# Patient Record
Sex: Male | Born: 1935 | Race: White | Hispanic: No | Marital: Married | Smoking: Current every day smoker
Health system: Southern US, Community
[De-identification: ages and names within clinical notes are randomized; demographics above are authoritative.]

## PROBLEM LIST (undated history)

## (undated) DIAGNOSIS — I10 Essential (primary) hypertension: Secondary | ICD-10-CM

## (undated) DIAGNOSIS — G2581 Restless legs syndrome: Secondary | ICD-10-CM

## (undated) DIAGNOSIS — J45909 Unspecified asthma, uncomplicated: Secondary | ICD-10-CM

## (undated) DIAGNOSIS — J449 Chronic obstructive pulmonary disease, unspecified: Secondary | ICD-10-CM

## (undated) HISTORY — PX: KNEE ARTHROPLASTY: SHX992

## (undated) HISTORY — PX: ROTATOR CUFF REPAIR: SHX139

## (undated) HISTORY — PX: BACK SURGERY: SHX140

## (undated) HISTORY — PX: HERNIA REPAIR: SHX51

---

## 2013-01-08 ENCOUNTER — Emergency Department (HOSPITAL_BASED_OUTPATIENT_CLINIC_OR_DEPARTMENT_OTHER): Payer: Medicare Other

## 2013-01-08 ENCOUNTER — Inpatient Hospital Stay (HOSPITAL_BASED_OUTPATIENT_CLINIC_OR_DEPARTMENT_OTHER)
Admission: EM | Admit: 2013-01-08 | Discharge: 2013-01-11 | DRG: 190 | Disposition: A | Discharge status: Home Health | Payer: Medicare Other | Attending: Internal Medicine | Admitting: Internal Medicine

## 2013-01-08 ENCOUNTER — Encounter (HOSPITAL_BASED_OUTPATIENT_CLINIC_OR_DEPARTMENT_OTHER): Payer: Self-pay | Admitting: Emergency Medicine

## 2013-01-08 DIAGNOSIS — I10 Essential (primary) hypertension: Secondary | ICD-10-CM | POA: Diagnosis present

## 2013-01-08 DIAGNOSIS — F17200 Nicotine dependence, unspecified, uncomplicated: Secondary | ICD-10-CM | POA: Diagnosis present

## 2013-01-08 DIAGNOSIS — J189 Pneumonia, unspecified organism: Secondary | ICD-10-CM | POA: Diagnosis present

## 2013-01-08 DIAGNOSIS — Z79899 Other long term (current) drug therapy: Secondary | ICD-10-CM

## 2013-01-08 DIAGNOSIS — J441 Chronic obstructive pulmonary disease with (acute) exacerbation: Secondary | ICD-10-CM

## 2013-01-08 DIAGNOSIS — J9690 Respiratory failure, unspecified, unspecified whether with hypoxia or hypercapnia: Secondary | ICD-10-CM

## 2013-01-08 HISTORY — DX: Essential (primary) hypertension: I10

## 2013-01-08 HISTORY — DX: Chronic obstructive pulmonary disease, unspecified: J44.9

## 2013-01-08 LAB — CBC WITH DIFFERENTIAL/PLATELET
Lymphocytes Relative: 8 % — ABNORMAL LOW (ref 12–46)
Lymphs Abs: 1 10*3/uL (ref 0.7–4.0)
MCHC: 33.6 g/dL (ref 30.0–36.0)
Neutro Abs: 10.9 10*3/uL — ABNORMAL HIGH (ref 1.7–7.7)
Neutrophils Relative %: 88 % — ABNORMAL HIGH (ref 43–77)
Platelets: 191 10*3/uL (ref 150–400)
RBC: 4.53 MIL/uL (ref 4.22–5.81)
RDW: 13.4 % (ref 11.5–15.5)
WBC: 12.4 10*3/uL — ABNORMAL HIGH (ref 4.0–10.5)

## 2013-01-08 LAB — PRO B NATRIURETIC PEPTIDE: Pro B Natriuretic peptide (BNP): 139.4 pg/mL (ref 0–450)

## 2013-01-08 LAB — MRSA PCR SCREENING: MRSA by PCR: NEGATIVE

## 2013-01-08 LAB — BASIC METABOLIC PANEL
CO2: 26 mEq/L (ref 19–32)
Glucose, Bld: 103 mg/dL — ABNORMAL HIGH (ref 70–99)
Potassium: 4.6 mEq/L (ref 3.7–5.3)
Sodium: 140 mEq/L (ref 137–147)

## 2013-01-08 LAB — TROPONIN I: Troponin I: <0.3 ng/mL (ref <0.30)

## 2013-01-08 MED ORDER — ALBUTEROL SULFATE (2.5 MG/3ML) 0.083% IN NEBU
5.0000 mg | INHALATION_SOLUTION | RESPIRATORY_TRACT | Status: AC
  Administered 2013-01-08 (×4): 5 mg via RESPIRATORY_TRACT
  Filled 2013-01-08: qty 6

## 2013-01-08 MED ORDER — DEXTROSE 5 % IV SOLN
1.0000 g | INTRAVENOUS | Status: DC
  Administered 2013-01-08: 1 g via INTRAVENOUS
  Filled 2013-01-08 (×2): qty 10

## 2013-01-08 MED ORDER — SODIUM CHLORIDE 0.9 % IV SOLN
INTRAVENOUS | Status: DC
  Administered 2013-01-08: 22:00:00 via INTRAVENOUS

## 2013-01-08 MED ORDER — ALBUTEROL (5 MG/ML) CONTINUOUS INHALATION SOLN
INHALATION_SOLUTION | RESPIRATORY_TRACT | Status: AC
  Administered 2013-01-08: 15 mg/h via RESPIRATORY_TRACT
  Filled 2013-01-08: qty 20

## 2013-01-08 MED ORDER — METHYLPREDNISOLONE SODIUM SUCC 125 MG IJ SOLR
60.0000 mg | Freq: Four times a day (QID) | INTRAMUSCULAR | Status: DC
  Administered 2013-01-08 – 2013-01-09 (×3): 60 mg via INTRAVENOUS
  Filled 2013-01-08 (×6): qty 0.96

## 2013-01-08 MED ORDER — MOMETASONE FURO-FORMOTEROL FUM 100-5 MCG/ACT IN AERO
2.0000 | INHALATION_SPRAY | Freq: Two times a day (BID) | RESPIRATORY_TRACT | Status: DC
  Administered 2013-01-09: 2 via RESPIRATORY_TRACT
  Filled 2013-01-08: qty 8.8

## 2013-01-08 MED ORDER — ROPINIROLE HCL 1 MG PO TABS
1.0000 mg | ORAL_TABLET | Freq: Two times a day (BID) | ORAL | Status: DC
  Administered 2013-01-08 – 2013-01-11 (×6): 1 mg via ORAL
  Filled 2013-01-08 (×7): qty 1

## 2013-01-08 MED ORDER — ALBUTEROL SULFATE HFA 108 (90 BASE) MCG/ACT IN AERS: 1.0000 | INHALATION_SPRAY | Freq: Four times a day (QID) | RESPIRATORY_TRACT | Status: DC | PRN

## 2013-01-08 MED ORDER — IPRATROPIUM-ALBUTEROL 0.5-2.5 (3) MG/3ML IN SOLN
3.0000 mL | RESPIRATORY_TRACT | Status: DC
  Administered 2013-01-08 – 2013-01-09 (×4): 3 mL via RESPIRATORY_TRACT
  Filled 2013-01-08 (×4): qty 3

## 2013-01-08 MED ORDER — ALBUTEROL (5 MG/ML) CONTINUOUS INHALATION SOLN
15.0000 mg/h | INHALATION_SOLUTION | RESPIRATORY_TRACT | Status: AC
  Administered 2013-01-08: 15 mg/h via RESPIRATORY_TRACT

## 2013-01-08 MED ORDER — LEVOFLOXACIN IN D5W 750 MG/150ML IV SOLN
750.0000 mg | Freq: Once | INTRAVENOUS | Status: AC
  Administered 2013-01-08: 750 mg via INTRAVENOUS
  Filled 2013-01-08: qty 150

## 2013-01-08 MED ORDER — ALBUTEROL SULFATE (2.5 MG/3ML) 0.083% IN NEBU
2.5000 mg | INHALATION_SOLUTION | RESPIRATORY_TRACT | Status: DC | PRN
  Administered 2013-01-10: 23:00:00 2.5 mg via RESPIRATORY_TRACT
  Filled 2013-01-08: qty 3

## 2013-01-08 MED ORDER — METHYLPREDNISOLONE SODIUM SUCC 125 MG IJ SOLR
125.0000 mg | Freq: Once | INTRAMUSCULAR | Status: AC
  Administered 2013-01-08: 125 mg via INTRAVENOUS
  Filled 2013-01-08: qty 2

## 2013-01-08 MED ORDER — HEPARIN SODIUM (PORCINE) 5000 UNIT/ML IJ SOLN
5000.0000 [IU] | Freq: Three times a day (TID) | INTRAMUSCULAR | Status: DC
  Administered 2013-01-08 – 2013-01-11 (×8): 5000 [IU] via SUBCUTANEOUS
  Filled 2013-01-08 (×11): qty 1

## 2013-01-08 MED ORDER — DEXTROSE 5 % IV SOLN
500.0000 mg | INTRAVENOUS | Status: DC
  Administered 2013-01-08: 500 mg via INTRAVENOUS
  Filled 2013-01-08 (×2): qty 500

## 2013-01-08 MED ORDER — GUAIFENESIN ER 600 MG PO TB12
600.0000 mg | ORAL_TABLET | Freq: Two times a day (BID) | ORAL | Status: DC
  Administered 2013-01-08 – 2013-01-11 (×6): 600 mg via ORAL
  Filled 2013-01-08 (×7): qty 1

## 2013-01-08 NOTE — Progress Notes (Signed)
Called by carelink team that patient continues to be tachypenic and wheezing despite treatments with albuterol, will change the bed assignment to step down.Patient was evaluated by the Ed physician Dr Karma Ganja at [facility] .

## 2013-01-08 NOTE — ED Notes (Signed)
Patient having SOB and ins/exp wheezing. Started last week. Patient saw PCP and was given medications, but not helping.

## 2013-01-08 NOTE — ED Provider Notes (Signed)
3:58 PM asked by carelink to assess patient.  Pt continues to have wheezing, and mild tachypnea.  Is due for next albuterol treatment now.  Per RT his clinical condition is improved since arrival- I have not seen this patient until now.  Per carelink he is going to med/surg bed.  I have asked that they request a telemetry bed for him as he will need a higher level of monitoring.  Starting another albuterol neb now.  Pt is alert, talking, in good spirits, with auditory wheezing on exam.    Ethelda Chick, MD 01/08/13 [phone]

## 2013-01-08 NOTE — Progress Notes (Signed)
ANTIBIOTIC CONSULT NOTE - INITIAL  Pharmacy Consult for Renal adjustment of antibiotics.  Indication: rule out pneumonia  No Known Allergies  Patient Measurements: Height: 5\' 7"  (170.2 cm) Weight: 145 lb (65.772 kg) IBW/kg (Calculated) : 66.1  Vital Signs: Temp: 98.2 F (36.8 C) (12/30 2000) Temp src: Axillary (12/30 2000) BP: 119/70 mmHg (12/30 2000) Pulse Rate: 102 (12/30 2000) Intake/Output from previous day:   Intake/Output from this shift: Total I/O In: -  Out: 100 [Urine:100]  Labs:  Recent Labs  01/08/13 1010  WBC 12.4*  HGB 14.5  PLT 191  CREATININE 1.30   Estimated Creatinine Clearance: 44.3 ml/min (by C-G formula based on Cr of 1.3). No results found for this basename: VANCOTROUGH, VANCOPEAK, VANCORANDOM, GENTTROUGH, GENTPEAK, GENTRANDOM, TOBRATROUGH, TOBRAPEAK, TOBRARND, AMIKACINPEAK, AMIKACINTROU, AMIKACIN,  in the last 72 hours   Microbiology: No results found for this or any previous visit (from the past 720 hour(s)).  Medical History: Past Medical History  Diagnosis Date  . COPD (chronic obstructive pulmonary disease)   . Hypertension     Medications:  Prescriptions prior to admission  Medication Sig Dispense Refill  . albuterol (PROVENTIL HFA;VENTOLIN HFA) 108 (90 BASE) MCG/ACT inhaler Inhale 1 puff into the lungs every 6 (six) hours as needed for wheezing or shortness of breath.       Marland Kitchen amLODipine (NORVASC) 5 MG tablet Take 5 mg by mouth daily.      Marland Kitchen atenolol (TENORMIN) 25 MG tablet Take 25 mg by mouth daily.      . Fluticasone-Salmeterol (ADVAIR) 250-50 MCG/DOSE AEPB Inhale 1 puff into the lungs 2 (two) times daily.      Marland Kitchen losartan (COZAAR) 100 MG tablet Take 100 mg by mouth daily.      . naproxen (NAPROSYN) 375 MG tablet Take 375 mg by mouth daily.      . predniSONE (DELTASONE) 10 MG tablet Take 20 mg by mouth daily with breakfast.      . rOPINIRole (REQUIP) 2 MG tablet Take 1 mg by mouth 2 (two) times daily. Takes  tablet daily at  dinnertime and  tablet every night at bedtime.      . sulfamethoxazole-trimethoprim (BACTRIM DS) 800-160 MG per tablet Take 1 tablet by mouth 2 (two) times daily. Takes for 10 days.  First dose taken 01/04/2013.       Assessment: 79 yo M admitted 01/08/2013 for SOB, possible PNA.  Pharmacy consulted to renally adjust antibiotics.  ID: Afebrile, WBC 12.4 12/30 >> azithromycin and ceftriaxone  Goal of Therapy:  Renal adjustment of antibiotics.   Plan:  Initiated on Ceftriaxone and azithromycin dosed appropriately for CAP.  No further dose adjustement for renal function necessary.  Pharmacy will sign off. Please call with questions.  Thank you for allowing pharmacy to be a part of this patients care team.  Lovenia Kim Pharm.D., BCPS Clinical Pharmacist 01/08/2013 9:05 PM Pager: [phone] Phone: [phone]

## 2013-01-08 NOTE — ED Notes (Signed)
[facility] RN made arrangements for pt to be admitted to a different unit than the one originally assigned to.  She will give report to the new receiving nurse.

## 2013-01-08 NOTE — H&P (Signed)
[facility] History and Physical  Patient: Joe Gonzalez  ZOX:[id number]  DOB: 07/20/1933  DOS: the patient was seen and examined on 01/08/2013 PCP: No primary provider on file.  Chief Complaint: shortness of breath and cough  HPI: Joe Gonzalez is a 79 y.o. male with Past medical history of COPD and hypertension. The patient is coming from home.  the patient presented with complaints of shortness of breath and cough which has been ongoing since last 5 days progressively worsening. The cough he is describing is a dry cough without any expectoration and shortness of breath was initially on exertion and right now on rest as well. He denies any chest pain or fever or denies any nausea or vomiting or aspiration he denies any diarrhea or constipation there is no active bleeding. He denies any leg swelling also denies any orthopnea or PND. He was apparently started on a prednisone taper few days ago which has made some improvements but not significantly. He denies any travel or recent sick contacts  Review of Systems: as mentioned in the history of present illness.  A Comprehensive review of the other systems is negative.  Past Medical History  Diagnosis Date  . COPD (chronic obstructive pulmonary disease)   . Hypertension    Past Surgical History  Procedure Laterality Date  . Hernia repair    . Back surgery    . Rotator cuff repair    . Knee arthroplasty     Social History:  reports that he has never smoked. He does not have any smokeless tobacco history on file. He reports that he does not drink alcohol or use illicit drugs. Independent for most of his  ADL.  No Known Allergies  No family history on file.  Prior to Admission medications   Medication Sig Start Date End Date Taking? Authorizing Provider  albuterol (PROVENTIL HFA;VENTOLIN HFA) 108 (90 BASE) MCG/ACT inhaler Inhale 1 puff into the lungs every 6 (six) hours as needed for wheezing or shortness of breath.    Yes  Historical Provider, MD  amLODipine (NORVASC) 5 MG tablet Take 5 mg by mouth daily.   Yes Historical Provider, MD  atenolol (TENORMIN) 25 MG tablet Take 25 mg by mouth daily.   Yes Historical Provider, MD  Fluticasone-Salmeterol (ADVAIR) 250-50 MCG/DOSE AEPB Inhale 1 puff into the lungs 2 (two) times daily.   Yes Historical Provider, MD  losartan (COZAAR) 100 MG tablet Take 100 mg by mouth daily.   Yes Historical Provider, MD  naproxen (NAPROSYN) 375 MG tablet Take 375 mg by mouth daily.   Yes Historical Provider, MD  predniSONE (DELTASONE) 10 MG tablet Take 20 mg by mouth daily with breakfast.   Yes Historical Provider, MD  rOPINIRole (REQUIP) 2 MG tablet Take 1 mg by mouth 2 (two) times daily. Takes  tablet daily at dinnertime and  tablet every night at bedtime.   Yes Historical Provider, MD  sulfamethoxazole-trimethoprim (BACTRIM DS) 800-160 MG per tablet Take 1 tablet by mouth 2 (two) times daily. Takes for 10 days.  First dose taken 01/04/2013.   Yes Historical Provider, MD    Physical Exam: Filed Vitals:   01/08/13 1558 01/08/13 1718 01/08/13 1719 01/08/13 2000  BP:   124/78 119/70  Pulse:   102 102  Temp:  98.2 F (36.8 C)  98.2 F (36.8 C)  TempSrc:  Oral  Axillary  Resp:   27 24  Height:      Weight:      SpO2:  94%  98% 97%    General: Alert, Awake and Oriented to Time, Place and Person. Appear in moderate distress Eyes: PERRL ENT: Oral Mucosa clear moist. Neck: no JVD Cardiovascular: S1 and S2 Present, no Murmur, Peripheral Pulses Present Respiratory: Bilateral Air entry equal and Decreased, basal Crackles,extensive wheezes Abdomen: Bowel Sound Present, Soft and Non tender Skin: no Rash Extremities: trace Pedal edema, no calf tenderness Neurologic: Grossly Unremarkable.  Labs on Admission:  CBC:  Recent Labs Lab 01/08/13 1010  WBC 12.4*  NEUTROABS 10.9*  HGB 14.5  HCT 43.2  MCV 95.4  PLT 191    CMP     Component Value Date/Time   NA 140 01/08/2013  1010   K 4.6 01/08/2013 1010   CL 104 01/08/2013 1010   CO2 26 01/08/2013 1010   GLUCOSE 103* 01/08/2013 1010   BUN 37* 01/08/2013 1010   CREATININE 1.30 01/08/2013 1010   CALCIUM 9.4 01/08/2013 1010   GFRNONAA 51* 01/08/2013 1010   GFRAA 59* 01/08/2013 1010    No results found for this basename: LIPASE, AMYLASE,  in the last 168 hours No results found for this basename: AMMONIA,  in the last 168 hours   Recent Labs Lab 01/08/13 1010  TROPONINI <0.30   BNP (last 3 results)  Recent Labs  01/08/13 1010  PROBNP 139.4    Radiological Exams on Admission: Dg Chest 2 View  01/08/2013   CLINICAL DATA:  Cough.  Short of breath.  Wheezing.  EXAM: CHEST  2 VIEW  COMPARISON:  None.  FINDINGS: Heart size is normal. There is calcification of the thoracic aorta up. The lungs show hyperinflation with areas of linear scarring. Markings are slightly more prominent the right lung base and there could be mild right base infiltrate. No effusions. No acute bony finding. Degenerative and postsurgical changes of the lumbar spine.  IMPRESSION: Chronic obstructive lung disease with hyperinflation and scarring. Cannot rule out minimal patchy infiltrate at the right lung base, though this may simply represent crowding of lung markings because of emphysema in the upper lungs.   Electronically Signed   By: Paulina Fusi M.D.   On: 01/08/2013 10:39    EKG: Independently reviewed. normal sinus rhythm, nonspecific ST and T waves changes.  Assessment/Plan Principal Problem:   COPD exacerbation Active Problems:   CAP (community acquired pneumonia)   Essential hypertension, benign   1. COPD exacerbation Patient is presenting with complaints of cough and shortness of breath and he has extensive wheezing on examination along with that his chest x-ray isn't suggestive of possible pneumonia as well for which he will be treated with community-acquired pneumonia with IV antibiotics ceftriaxone and azithromycin.  Also he will be given IV Solu-Medrol to nebulizations and oxygen as needed He is admitted in the step down unit BiPAP would be given as needed with breaks Mucinex as well as flutter device will also be given for assistance with sputum purulence  2. Hypertension At present I would hold his antihypertensive due to soft blood pressures  DVT Prophylaxis: subcutaneous Heparin Nutrition: Advance as tolerated cardiac diet  Code Status: Full  Disposition: Admitted to inpatient in step-down unit.  Author: Lynden Oxford, MD [facility] Pager: [phone] 01/08/2013, 8:56 PM    If 7PM-7AM, please contact night-coverage [email or web address] Password [email or web address]

## 2013-01-08 NOTE — ED Provider Notes (Signed)
CSN: [id number]     Arrival date & time 01/08/13  1478 History   First MD Initiated Contact with Patient 01/08/13 [phone]     Chief Complaint  Patient presents with  . Shortness of Breath   (Consider location/radiation/quality/duration/timing/severity/associated sxs/prior Treatment) Patient is a 79 y.o. male presenting with shortness of breath.  Shortness of Breath  Pt with history of COPD has had symptoms off and on for several weeks, but worse in the last 4-5 days. He has had wheezing, dry cough and SOB. He was seen by PCP who 'didn't do anything'. Seen 2 days ago at [facility] who gave him neb, CXR and prednisone taper with minimal improvement. He has continued to his use home nebs. Reports symptoms more persistent this time compared to previous episodes. Denies fever, leg swelling or chest pain.   Past Medical History  Diagnosis Date  . COPD (chronic obstructive pulmonary disease)   . Hypertension    History reviewed. No pertinent past surgical history. No family history on file. History  Substance Use Topics  . Smoking status: Never Smoker   . Smokeless tobacco: Not on file  . Alcohol Use: No    Review of Systems  Respiratory: Positive for shortness of breath.    All other systems reviewed and are negative except as noted in HPI.   Allergies  Review of patient's allergies indicates no known allergies.  Home Medications   Current Outpatient Rx  Name  Route  Sig  Dispense  Refill  . albuterol (PROVENTIL HFA;VENTOLIN HFA) 108 (90 BASE) MCG/ACT inhaler   Inhalation   Inhale into the lungs every 6 (six) hours as needed for wheezing or shortness of breath.         Marland Kitchen atenolol (TENORMIN) 25 MG tablet   Oral   Take 25 mg by mouth daily.         . Fluticasone-Salmeterol (ADVAIR) 250-50 MCG/DOSE AEPB   Inhalation   Inhale 1 puff into the lungs 2 (two) times daily.         Marland Kitchen losartan (COZAAR) 100 MG tablet   Oral   Take 100 mg by mouth daily.         .  predniSONE (DELTASONE) 10 MG tablet   Oral   Take 10 mg by mouth daily with breakfast.         . sulfamethoxazole-trimethoprim (BACTRIM DS) 800-160 MG per tablet   Oral   Take 1 tablet by mouth 2 (two) times daily.          BP 152/76  Pulse 72  Resp 20  SpO2 93% Physical Exam  Nursing note and vitals reviewed. Constitutional: He is oriented to person, place, and time. He appears well-developed and well-nourished.  HENT:  Head: Normocephalic and atraumatic.  Eyes: EOM are normal. Pupils are equal, round, and reactive to light.  Neck: Normal range of motion. Neck supple.  Cardiovascular: Normal rate, normal heart sounds and intact distal pulses.   Pulmonary/Chest: He is in respiratory distress. He has wheezes. He has no rales. He exhibits no tenderness.  Increased work of breathing  Abdominal: Bowel sounds are normal. He exhibits no distension. There is no tenderness.  Musculoskeletal: Normal range of motion. He exhibits no edema and no tenderness.  Neurological: He is alert and oriented to person, place, and time. He has normal strength. No cranial nerve deficit or sensory deficit.  Skin: Skin is warm and dry. No rash noted.  Psychiatric: He has a normal mood and  affect.    ED Course  Procedures (including critical care time) Labs Review Labs Reviewed  CBC WITH DIFFERENTIAL - Abnormal; Notable for the following:    WBC 12.4 (*)    Neutrophils Relative % 88 (*)    Neutro Abs 10.9 (*)    Lymphocytes Relative 8 (*)    All other components within normal limits  BASIC METABOLIC PANEL - Abnormal; Notable for the following:    Glucose, Bld 103 (*)    BUN 37 (*)    GFR calc non Af Amer 51 (*)    GFR calc Af Amer 59 (*)    All other components within normal limits  PRO B NATRIURETIC PEPTIDE  TROPONIN I   Imaging Review Dg Chest 2 View  01/08/2013   CLINICAL DATA:  Cough.  Short of breath.  Wheezing.  EXAM: CHEST  2 VIEW  COMPARISON:  None.  FINDINGS: Heart size is  normal. There is calcification of the thoracic aorta up. The lungs show hyperinflation with areas of linear scarring. Markings are slightly more prominent the right lung base and there could be mild right base infiltrate. No effusions. No acute bony finding. Degenerative and postsurgical changes of the lumbar spine.  IMPRESSION: Chronic obstructive lung disease with hyperinflation and scarring. Cannot rule out minimal patchy infiltrate at the right lung base, though this may simply represent crowding of lung markings because of emphysema in the upper lungs.   Electronically Signed   By: Paulina Fusi M.D.   On: 01/08/2013 10:39    EKG Interpretation    Date/Time:  Tuesday January 08 2013 10:10:33 EST Ventricular Rate:  68 PR Interval:  158 QRS Duration: 130 QT Interval:  428 QTC Calculation: 455 R Axis:   -52 Text Interpretation:  Sinus rhythm with Fusion complexes Left axis deviation Left bundle branch block Abnormal ECG No old tracing to compare Confirmed by SHELDON  MD, CHARLES ([id number]) on 01/08/2013 10:20:53 AM            MDM   1. COPD exacerbation   2. CAP (community acquired pneumonia)      Labs and imaging reviewed. Minimal improvement with 15mg  CAT. Will give Levaquin for suspected CAP. Admit for further treatment of COPD.    Charles B. Bernette Mayers, MD 01/08/13 [phone]

## 2013-01-09 DIAGNOSIS — J441 Chronic obstructive pulmonary disease with (acute) exacerbation: Principal | ICD-10-CM

## 2013-01-09 LAB — BASIC METABOLIC PANEL
CO2: 25 mEq/L (ref 19–32)
Calcium: 8.7 mg/dL (ref 8.4–10.5)
Chloride: 104 mEq/L (ref 96–112)
Creatinine, Ser: 1.35 mg/dL (ref 0.50–1.35)
GFR calc Af Amer: 57 mL/min — ABNORMAL LOW (ref >90)
Glucose, Bld: 134 mg/dL — ABNORMAL HIGH (ref 70–99)

## 2013-01-09 LAB — CBC
HCT: 40.9 % (ref 39.0–52.0)
Hemoglobin: 13.9 g/dL (ref 13.0–17.0)
MCH: 32.4 pg (ref 26.0–34.0)
MCHC: 34 g/dL (ref 30.0–36.0)
MCV: 95.3 fL (ref 78.0–100.0)
Platelets: 169 10*3/uL (ref 150–400)
RBC: 4.29 MIL/uL (ref 4.22–5.81)

## 2013-01-09 LAB — EXPECTORATED SPUTUM ASSESSMENT W GRAM STAIN, RFLX TO RESP C

## 2013-01-09 LAB — INFLUENZA PANEL BY PCR (TYPE A & B): Influenza A By PCR: NEGATIVE

## 2013-01-09 MED ORDER — PNEUMOCOCCAL VAC POLYVALENT 25 MCG/0.5ML IJ INJ
0.5000 mL | INJECTION | INTRAMUSCULAR | Status: AC
  Administered 2013-01-10: 0.5 mL via INTRAMUSCULAR
  Filled 2013-01-09: qty 0.5

## 2013-01-09 MED ORDER — METHYLPREDNISOLONE SODIUM SUCC 125 MG IJ SOLR
60.0000 mg | Freq: Three times a day (TID) | INTRAMUSCULAR | Status: DC
  Administered 2013-01-09 – 2013-01-11 (×5): 60 mg via INTRAVENOUS
  Filled 2013-01-09 (×8): qty 0.96

## 2013-01-09 MED ORDER — ATENOLOL 25 MG PO TABS
25.0000 mg | ORAL_TABLET | Freq: Every day | ORAL | Status: DC
  Administered 2013-01-09 – 2013-01-11 (×3): 25 mg via ORAL
  Filled 2013-01-09 (×3): qty 1

## 2013-01-09 MED ORDER — IPRATROPIUM BROMIDE 0.02 % IN SOLN
0.5000 mg | Freq: Four times a day (QID) | RESPIRATORY_TRACT | Status: DC
  Administered 2013-01-09 (×2): 0.5 mg via RESPIRATORY_TRACT
  Filled 2013-01-09 (×2): qty 2.5

## 2013-01-09 MED ORDER — ALBUTEROL SULFATE (2.5 MG/3ML) 0.083% IN NEBU
2.5000 mg | INHALATION_SOLUTION | Freq: Four times a day (QID) | RESPIRATORY_TRACT | Status: DC
  Administered 2013-01-09 (×2): 2.5 mg via RESPIRATORY_TRACT
  Filled 2013-01-09 (×2): qty 3

## 2013-01-09 MED ORDER — IPRATROPIUM-ALBUTEROL 0.5-2.5 (3) MG/3ML IN SOLN
3.0000 mL | Freq: Four times a day (QID) | RESPIRATORY_TRACT | Status: DC
  Administered 2013-01-09 – 2013-01-11 (×7): 3 mL via RESPIRATORY_TRACT
  Filled 2013-01-09 (×35): qty 3

## 2013-01-09 MED ORDER — AMLODIPINE BESYLATE 5 MG PO TABS
5.0000 mg | ORAL_TABLET | Freq: Every day | ORAL | Status: DC
  Administered 2013-01-09 – 2013-01-11 (×3): 5 mg via ORAL
  Filled 2013-01-09 (×3): qty 1

## 2013-01-09 MED ORDER — ALBUTEROL SULFATE (2.5 MG/3ML) 0.083% IN NEBU
2.5000 mg | INHALATION_SOLUTION | RESPIRATORY_TRACT | Status: DC | PRN
  Filled 2013-01-09: qty 3

## 2013-01-09 NOTE — Progress Notes (Signed)
[facility] Progress Note [name]   Joe Gonzalez PIR:[id number] DOB: 10/10/1933 DOA: 01/08/2013 PCP: No primary provider on file.  Admit HPI / Brief Narrative: 79 y.o. male with medical history of COPD and hypertension who presented with complaints of shortness of breath and cough for 5 days. He denied any chest pain or fever. He denied any leg swelling, orthopnea, or PND.   HPI/Subjective: Patient feels somewhat better but admits that he is not back to his baseline.  He continues to feel severely short of breath with the slightest exertion.  Assessment/Plan:  Acute bronchospastic COPD exacerbation  Flu negative - continue maximal medical therapy - counseled patient as to the absolute need to discontinue tobacco abuse  HTN BP mildly elevated - follow trend w/o change in tx plan today   Code Status: FULL Family Communication: no family present at time of exam Disposition Plan: Stable for transfer to a medical bed - continue serial exam - begin to ambulate  Consultants: none  Procedures: none  Antibiotics: Azithro 12/30 Rocephin 12/30 Levaquin 12/30 >>  DVT prophylaxis: SQ heparin   Objective: Blood pressure 156/72, pulse 93, temperature 98.1 F (36.7 C), temperature source Oral, resp. rate 21, height 5\' 7"  (1.702 m), weight 65.772 kg (145 lb), SpO2 93.00%.  Intake/Output Summary (Last 24 hours) at 01/09/13 1131 Last data filed at 01/09/13 0900  Gross per 24 hour  Intake    890 ml  Output    600 ml  Net    290 ml   Exam: General: Mild tachypnea at rest Lungs: Poor air movement throughout all fields with no focal crackles with diffuse expiratory wheezing Cardiovascular: Regular rate and rhythm without murmur gallop or rub normal S1 and S2 - heart sounds distant Abdomen: Nontender, nondistended, soft, bowel sounds positive, no rebound, no ascites, no appreciable mass Extremities: No significant cyanosis, clubbing, or edema  bilateral lower extremities  Data Reviewed: Basic Metabolic Panel:  Recent Labs Lab 01/08/13 1010 01/09/13 0820  NA 140 140  K 4.6 4.7  CL 104 104  CO2 26 25  GLUCOSE 103* 134*  BUN 37* 40*  CREATININE 1.30 1.35  CALCIUM 9.4 8.7   Liver Function Tests: No results found for this basename: AST, ALT, ALKPHOS, BILITOT, PROT, ALBUMIN,  in the last 168 hours  CBC:  Recent Labs Lab 01/08/13 1010 01/09/13 0820  WBC 12.4* 14.8*  NEUTROABS 10.9*  --   HGB 14.5 13.9  HCT 43.2 40.9  MCV 95.4 95.3  PLT 191 169   Cardiac Enzymes:  Recent Labs Lab 01/08/13 1010  TROPONINI <0.30   BNP (last 3 results)  Recent Labs  01/08/13 1010  PROBNP 139.4    Recent Results (from the past 240 hour(s))  MRSA PCR SCREENING     Status: None   Collection Time    01/08/13  5:29 PM      Result Value Range Status   MRSA by PCR NEGATIVE  NEGATIVE Final   Comment:            The GeneXpert MRSA Assay (FDA     approved for NASAL specimens     only), is one component of a     comprehensive MRSA colonization     surveillance program. It is not     intended to diagnose MRSA     infection nor to guide or     monitor treatment for     MRSA infections.  Studies:  Recent x-ray studies have been reviewed in detail by the Attending Physician  Scheduled Meds:  Scheduled Meds: . azithromycin  500 mg Intravenous Q24H  . cefTRIAXone (ROCEPHIN)  IV  1 g Intravenous Q24H  . guaiFENesin  600 mg Oral BID  . heparin  5,000 Units Subcutaneous Q8H  . ipratropium-albuterol  3 mL Nebulization Q4H  . methylPREDNISolone (SOLU-MEDROL) injection  60 mg Intravenous Q6H  . mometasone-formoterol  2 puff Inhalation BID  . [START ON 01/10/2013] pneumococcal 23 valent vaccine  0.5 mL Intramuscular Tomorrow-1000  . rOPINIRole  1 mg Oral BID    Time spent on care of this patient: 35 mins   [facility] T  [facility] Office  [phone] Pager - Text Page per Loretha Stapler as per  below:  On-Call/Text Page:      [email or web address]      password [email or web address]  If 7PM-7AM, please contact night-coverage [email or web address] Password [email or web address] 01/09/2013, 11:31 AM   LOS: 1 day

## 2013-01-09 NOTE — Progress Notes (Signed)
Received report from Paula, RN

## 2013-01-09 NOTE — Progress Notes (Signed)
Called back to followup with report on pt at 9:12 pm. Spoke with Fleet Contras, the [employer], who said that the nurse was getting a new admit settled and will call me back as soon as she is able.

## 2013-01-09 NOTE — Progress Notes (Signed)
Utilization review completed.  

## 2013-01-09 NOTE — Progress Notes (Signed)
Called to get report on pt. Nurse will call back .

## 2013-01-10 DIAGNOSIS — I10 Essential (primary) hypertension: Secondary | ICD-10-CM

## 2013-01-10 MED ORDER — ALBUTEROL SULFATE (2.5 MG/3ML) 0.083% IN NEBU: 2.5000 mg | INHALATION_SOLUTION | Freq: Four times a day (QID) | RESPIRATORY_TRACT | Status: DC

## 2013-01-10 NOTE — Plan of Care (Signed)
Problem: Phase I Progression Outcomes Goal: O2 sats > or equal 90% or at baseline Outcome: Progressing O2 sat on the 90's Goal: Progress activity as tolerated unless otherwise ordered Outcome: Progressing Ambulatory

## 2013-01-10 NOTE — Progress Notes (Signed)
SATURATION QUALIFICATIONS: (This note is used to comply with regulatory documentation for home oxygen)  Patient Saturations on Room Air at Rest = 93%  Patient Saturations on Room Air while Ambulating = 83%   O2 placed back on patient 3L

## 2013-01-10 NOTE — Progress Notes (Signed)
Patient alert and verbally responsive. Able to make his needs known.Patient noted having significant amount of audible wheezing O2 sat 90% on a 2L oxygen via nasal cannula. Respiratory therapist notified to give Albuterol PRN. Denies any chest pain. Ambulatory. Call bell within reach. Will continue to monitor.

## 2013-01-10 NOTE — Progress Notes (Signed)
[facility] Progress Note [place] TEAM 1 - Stepdown/ICU TEAM   CLERENCE GUBSER ZOX:[id number] DOB: 1933/08/12 DOA: 01/08/2013 PCP: No primary provider on file.  Admit HPI / Brief Narrative: 80 y.o. male with medical history of COPD and hypertension who presented with complaints of shortness of breath and cough for 5 days. He denied any chest pain or fever. He denied any leg swelling, orthopnea, or PND.   HPI/Subjective: Patient feels better and wants to go home- wife at bedside states he is still not at baseline.  Assessment/Plan:  Acute bronchospastic COPD exacerbation  Flu negative - continue maximal medical therapy - Dr Dolphus Jenny counseled patient as to the absolute need to discontinue tobacco abuse - still with a significant amount of wheezing today and hypoxia with Pulse ox down to 80s on exertion therefore will not be able to discharge home today  HTN BP mildly elevated - follow trend w/o change in tx plan today   Code Status: FULL Family Communication: with wife Disposition Plan: home   Consultants: none  Procedures: none  Antibiotics: Azithro 12/30 Rocephin 12/30 Levaquin 12/30 >>  DVT prophylaxis: SQ heparin   Objective: Blood pressure 166/73, pulse 89, temperature 97.6 F (36.4 C), temperature source Oral, resp. rate 24, height 5\' 7"  (1.702 m), weight 65.772 kg (145 lb), SpO2 93.00%.  Intake/Output Summary (Last 24 hours) at 01/10/13 1714 Last data filed at 01/10/13 0432  Gross per 24 hour  Intake    630 ml  Output    200 ml  Net    430 ml   Exam: General: AAO x 3 Lungs: wheezing in all lung fields  Cardiovascular: Regular rate and rhythm without murmur gallop or rub normal S1 and S2 - heart sounds distant Abdomen: Nontender, nondistended, soft, bowel sounds positive, no rebound, no ascites, no appreciable mass Extremities: No significant cyanosis, clubbing, or edema bilateral lower extremities  Data Reviewed: Basic Metabolic Panel:  Recent  Labs Lab 01/08/13 1010 01/09/13 0820  NA 140 140  K 4.6 4.7  CL 104 104  CO2 26 25  GLUCOSE 103* 134*  BUN 37* 40*  CREATININE 1.30 1.35  CALCIUM 9.4 8.7   Liver Function Tests: No results found for this basename: AST, ALT, ALKPHOS, BILITOT, PROT, ALBUMIN,  in the last 168 hours  CBC:  Recent Labs Lab 01/08/13 1010 01/09/13 0820  WBC 12.4* 14.8*  NEUTROABS 10.9*  --   HGB 14.5 13.9  HCT 43.2 40.9  MCV 95.4 95.3  PLT 191 169   Cardiac Enzymes:  Recent Labs Lab 01/08/13 1010  TROPONINI <0.30   BNP (last 3 results)  Recent Labs  01/08/13 1010  PROBNP 139.4    Recent Results (from the past 240 hour(s))  MRSA PCR SCREENING     Status: None   Collection Time    01/08/13  5:29 PM      Result Value Range Status   MRSA by PCR NEGATIVE  NEGATIVE Final   Comment:            The GeneXpert MRSA Assay (FDA     approved for NASAL specimens     only), is one component of a     comprehensive MRSA colonization     surveillance program. It is not     intended to diagnose MRSA     infection nor to guide or     monitor treatment for     MRSA infections.  CULTURE, BLOOD (ROUTINE X 2)  Status: None   Collection Time    01/08/13  9:41 PM      Result Value Range Status   Specimen Description BLOOD LEFT ARM   Final   Special Requests BOTTLES DRAWN AEROBIC AND ANAEROBIC 10CC EACH   Final   Culture  Setup Time     Final   Value: 01/09/2013 05:28     Performed at [facility]   Culture     Final   Value:        BLOOD CULTURE RECEIVED NO GROWTH TO DATE CULTURE WILL BE HELD FOR 5 DAYS BEFORE ISSUING A FINAL NEGATIVE REPORT     Performed at [facility]   Report Status PENDING   Incomplete  CULTURE, BLOOD (ROUTINE X 2)     Status: None   Collection Time    01/08/13  9:46 PM      Result Value Range Status   Specimen Description BLOOD RIGHT FOREARM   Final   Special Requests BOTTLES DRAWN AEROBIC ONLY 10CC   Final   Culture  Setup Time     Final    Value: 01/09/2013 05:28     Performed at [facility]   Culture     Final   Value:        BLOOD CULTURE RECEIVED NO GROWTH TO DATE CULTURE WILL BE HELD FOR 5 DAYS BEFORE ISSUING A FINAL NEGATIVE REPORT     Performed at [facility]   Report Status PENDING   Incomplete  CULTURE, EXPECTORATED SPUTUM-ASSESSMENT     Status: None   Collection Time    01/09/13 12:58 PM      Result Value Range Status   Specimen Description SPUTUM   Final   Special Requests NONE   Final   Sputum evaluation     Final   Value: MICROSCOPIC FINDINGS SUGGEST THAT THIS SPECIMEN IS NOT REPRESENTATIVE OF LOWER RESPIRATORY SECRETIONS. PLEASE RECOLLECT.     CALLED TO SCHMIDT M RN 01/09/13 1333 COSTELLO B   Report Status 01/09/2013 FINAL   Final     Studies:  Recent x-ray studies have been reviewed in detail by the Attending Physician  Scheduled Meds:  Scheduled Meds: . amLODipine  5 mg Oral Daily  . atenolol  25 mg Oral Daily  . guaiFENesin  600 mg Oral BID  . heparin  5,000 Units Subcutaneous Q8H  . ipratropium-albuterol  3 mL Nebulization QID  . methylPREDNISolone (SOLU-MEDROL) injection  60 mg Intravenous Q8H  . rOPINIRole  1 mg Oral BID    Time spent on care of this patient: 35 mins   [facility]  [facility] Office  [phone] Pager - Text Page per Loretha StaplerAmion as per below:  On-Call/Text Page:      [email or web address]      password [email or web address]  If 7PM-7AM, please contact night-coverage [email or web address] Password [email or web address] 01/10/2013, 5:14 PM   LOS: 2 days

## 2013-01-10 NOTE — Progress Notes (Signed)
Pt admitted to unit, room 5w18. Pt alert, oriented, and pleasant. Pt presented with expiratory wheezing, but had received a respiratory tx prior to being admitted to the floor. Pt reports that this is his norm. Skin is intact, small amt of ecchymosis noted on pt arms. Pt oriented to room. Call bell within reach. Will continue to monitor pt per MD order.

## 2013-01-11 DIAGNOSIS — J9600 Acute respiratory failure, unspecified whether with hypoxia or hypercapnia: Secondary | ICD-10-CM

## 2013-01-11 LAB — LEGIONELLA ANTIGEN, URINE: Legionella Antigen, Urine: NEGATIVE

## 2013-01-11 MED ORDER — BENZONATATE 100 MG PO CAPS: 100.0000 mg | ORAL_CAPSULE | Freq: Two times a day (BID) | ORAL | Status: DC | PRN

## 2013-01-11 MED ORDER — IPRATROPIUM-ALBUTEROL 0.5-2.5 (3) MG/3ML IN SOLN: 3.0000 mL | Freq: Four times a day (QID) | RESPIRATORY_TRACT | Status: DC

## 2013-01-11 MED ORDER — PREDNISONE (PAK) 10 MG PO TABS: ORAL_TABLET | ORAL | Status: DC

## 2013-01-11 MED ORDER — AMLODIPINE BESYLATE 10 MG PO TABS: 10.0000 mg | ORAL_TABLET | Freq: Every day | ORAL | Status: DC

## 2013-01-11 MED ORDER — DOXYCYCLINE HYCLATE 50 MG PO CAPS: 100.0000 mg | ORAL_CAPSULE | Freq: Two times a day (BID) | ORAL | Status: DC

## 2013-01-11 MED ORDER — AMLODIPINE BESYLATE 10 MG PO TABS
10.0000 mg | ORAL_TABLET | Freq: Every day | ORAL | Status: DC
  Administered 2013-01-11: 10 mg via ORAL
  Filled 2013-01-11: qty 1

## 2013-01-11 MED ORDER — GUAIFENESIN ER 600 MG PO TB12: 600.0000 mg | ORAL_TABLET | Freq: Two times a day (BID) | ORAL | Status: DC

## 2013-01-11 NOTE — Progress Notes (Signed)
Joe FoleyBobby L Gonzalez to be D/C'd Home per MD order. Discussed with the patient and all questions fully answered.    Medication List    STOP taking these medications       naproxen 375 MG tablet  Commonly known as:  NAPROSYN     predniSONE 10 MG tablet  Commonly known as:  DELTASONE  Replaced by:  predniSONE 10 MG tablet     sulfamethoxazole-trimethoprim 800-160 MG per tablet  Commonly known as:  BACTRIM DS      TAKE these medications       albuterol 108 (90 BASE) MCG/ACT inhaler  Commonly known as:  PROVENTIL HFA;VENTOLIN HFA  Inhale 1 puff into the lungs every 6 (six) hours as needed for wheezing or shortness of breath.     amLODipine 10 MG tablet  Commonly known as:  NORVASC  Take 1 tablet (10 mg total) by mouth daily.     atenolol 25 MG tablet  Commonly known as:  TENORMIN  Take 25 mg by mouth daily.     benzonatate 100 MG capsule  Commonly known as:  TESSALON PERLES  Take 1 capsule (100 mg total) by mouth 2 (two) times daily as needed for cough.     doxycycline 50 MG capsule  Commonly known as:  VIBRAMYCIN  Take 2 capsules (100 mg total) by mouth 2 (two) times daily.     Fluticasone-Salmeterol 250-50 MCG/DOSE Aepb  Commonly known as:  ADVAIR  Inhale 1 puff into the lungs 2 (two) times daily.     guaiFENesin 600 MG 12 hr tablet  Commonly known as:  MUCINEX  Take 1 tablet (600 mg total) by mouth 2 (two) times daily.     ipratropium-albuterol 0.5-2.5 (3) MG/3ML Soln  Commonly known as:  DUONEB  Take 3 mLs by nebulization 4 (four) times daily.     losartan 100 MG tablet  Commonly known as:  COZAAR  Take 100 mg by mouth daily.     predniSONE 10 MG tablet  Commonly known as:  STERAPRED UNI-PAK  Take 6 tab po q daily for 3 days, then 5 tabs po q daily for 3 days, then 4 tabs po q daily for 3 days, then 3 tabs po q daily for 3 days, then 2 tabs po q daily for 3 days, then 1 tab po q daily for 3 days then stop     rOPINIRole 2 MG tablet  Commonly known as:  REQUIP   Take 1 mg by mouth 2 (two) times daily. Takes  tablet daily at dinnertime and  tablet every night at bedtime.        VVS, Skin clean, dry and intact without evidence of skin break down, no evidence of skin tears noted.  IV catheter discontinued intact. Site without signs and symptoms of complications. Dressing and pressure applied.  An After Visit Summary was printed and given to the patient.  Patient escorted via WC, and D/C home via private auto.  Joe Gonzalez, Joe Gonzalez  01/11/2013 1:49 PM

## 2013-01-11 NOTE — Progress Notes (Signed)
Patient having an oxygen saturation of 82% on exertion, improving to 92% with 2 liters of Supplemental Oxygen.

## 2013-01-11 NOTE — Progress Notes (Signed)
   CARE MANAGEMENT NOTE 01/11/2013  Patient:  Sear,Rishit L   Account Number:  [id number]  Date Initiated:  01/09/2013  Documentation initiated by:  Donn PieriniWEBSTER,KRISTI  Subjective/Objective Assessment:   Pt admitted with COPD     Action/Plan:   PTA pt lived at home with spouse- NCM to follow for d/c needs   Anticipated DC Date:  01/11/2013   Anticipated DC Plan:  HOME/SELF CARE      DC Planning Services  CM consult      Choice offered to / List presented to:     DME arranged  [facility]      DME agency  [facility]        Status of service:  Completed, signed off Medicare Important Message given?   (If response is "NO", the following Medicare IM given date fields will be blank) Date Medicare IM given:   Date Additional Medicare IM given:    Discharge Disposition:  HOME W HOME HEALTH SERVICES  Per UR Regulation:  Reviewed for med. necessity/level of care/duration of stay  If discussed at Long Length of Stay Meetings, dates discussed:    Comments:  01/12/2012 1115 NCM spoke to pt and verified address. Explained [facility] will bring portable to his room and deliver concentrator to his home. Isidoro DonningAlesia Dilyn Smiles RN CCM Case Mgmt phone [phone]

## 2013-01-11 NOTE — Progress Notes (Signed)
SATURATION QUALIFICATIONS: (This note is used to comply with regulatory documentation for home oxygen)  Patient Saturations on Room Air at Rest = 89%  Patient Saturations on Room Air while Ambulating = 77%  Patient Saturations on 3 Liters of oxygen while Ambulating = 92%

## 2013-01-11 NOTE — Discharge Summary (Signed)
Physician Discharge Summary  Joe Gonzalez:[id number] DOB: [birth year] DOA: 01/08/2013  PCP: No primary provider on file.  Admit date: 01/08/2013 Discharge date: 01/11/2013  Time spent: 35 minutes  Recommendations for Outpatient Follow-up:  1. Please follow up on blood pressures, I increased his norvasc from 5 to 10mg  PO q Daily  Discharge Diagnoses:  Principal Problem:   COPD exacerbation Active Problems:   CAP (community acquired pneumonia)   Essential hypertension, benign   Discharge Condition: Stable/improved  Diet recommendation: Heart healthy diet  Filed Weights   01/08/13 1359  Weight: 65.772 kg (145 lb)    History of present illness:  Joe Gonzalez is a 80 y.o. male with Past medical history of COPD and hypertension.  The patient is coming from home.  the patient presented with complaints of shortness of breath and cough which has been ongoing since last 5 days progressively worsening. The cough he is describing is a dry cough without any expectoration and shortness of breath was initially on exertion and right now on rest as well. He denies any chest pain or fever or denies any nausea or vomiting or aspiration he denies any diarrhea or constipation there is no active bleeding. He denies any leg swelling also denies any orthopnea or PND.  He was apparently started on a prednisone taper few days ago which has made some improvements but not significantly. He denies any travel or recent sick contacts  Hospital Course:  Patient is a pleasant 80 year old gentleman with a past medical history of COPD, was admitted to the medicine service on 01/08/2013, presenting to the emergency department with complaints of shortness of breath cough and wheezing. Prior to presentation he had been on a course of steroids and antibiotics, despite this, patient progressively worsening. He was initially admitted to the step down unit where he was started on IV steroids, nebulizers, and inhaled  steroids. He was transferred out of the step down unit on 01/10/2013 given clinical improvement. Patient improved by 01/11/2013, and reported feeling well enough to go home. We did ambulated down the hallway, which she seemed to tolerate well however his oxygen saturations dropped to 82% on room air. He otherwise was afebrile, tolerating by mouth intake, as his wife present at bedside reported that he was doing much better and near his baseline. Patient was setup with home oxygen, 2 L, continuous, prior to discharge. I had an extensive talk with them regarding tobacco abuse cessation, unfortunately patient reporting that he was not ready to quit smoking. With this I strongly warned him about smoking and wearing supplemental oxygen. He verbalized his understanding and reassured me that he would remove his supplemental oxygen if he were to smoke. Patient was setup to followup with pulmonary medicine in 1 week.    Consultations:  Social Work to set patient up with home oxygen  Discharge Exam: Filed Vitals:   01/11/13 0935  BP: 169/84  Pulse: 81  Temp:   Resp:     General: Patient is in no acute distress, he appears to be breathing comfortably, competing his sentences, and is asking to go home today.  Cardiovascular: Regular rate and rhythm,l normal S1S2 Respiratory: He has a few expiratory wheezes, diminished breath sounds bilaterally, however does not have evidence of respiratory distress, breathing comfortably.   Discharge Instructions  Discharge Orders   Future Appointments Provider Department Dept Phone   01/17/2013 3:30 PM [name], MD [facility] [phone]   Future Orders Complete By Expires  Call MD for:  difficulty breathing, headache or visual disturbances  As directed    Call MD for:  extreme fatigue  As directed    Call MD for:  persistant dizziness or light-headedness  As directed    Call MD for:  persistant nausea and vomiting  As directed    Call MD for:   severe uncontrolled pain  As directed    Call MD for:  temperature >100.4  As directed    Diet - low sodium heart healthy  As directed    Discharge instructions  As directed    Comments:     Please follow up with Dr Sherene Sires on Pulmonary Medicine on Jan 8 at 3:30 pm   Increase activity slowly  As directed        Medication List    STOP taking these medications       naproxen 375 MG tablet  Commonly known as:  NAPROSYN     predniSONE 10 MG tablet  Commonly known as:  DELTASONE  Replaced by:  predniSONE 10 MG tablet     sulfamethoxazole-trimethoprim 800-160 MG per tablet  Commonly known as:  BACTRIM DS      TAKE these medications       albuterol 108 (90 BASE) MCG/ACT inhaler  Commonly known as:  PROVENTIL HFA;VENTOLIN HFA  Inhale 1 puff into the lungs every 6 (six) hours as needed for wheezing or shortness of breath.     amLODipine 10 MG tablet  Commonly known as:  NORVASC  Take 1 tablet (10 mg total) by mouth daily.     atenolol 25 MG tablet  Commonly known as:  TENORMIN  Take 25 mg by mouth daily.     benzonatate 100 MG capsule  Commonly known as:  TESSALON PERLES  Take 1 capsule (100 mg total) by mouth 2 (two) times daily as needed for cough.     doxycycline 50 MG capsule  Commonly known as:  VIBRAMYCIN  Take 2 capsules (100 mg total) by mouth 2 (two) times daily.     Fluticasone-Salmeterol 250-50 MCG/DOSE Aepb  Commonly known as:  ADVAIR  Inhale 1 puff into the lungs 2 (two) times daily.     guaiFENesin 600 MG 12 hr tablet  Commonly known as:  MUCINEX  Take 1 tablet (600 mg total) by mouth 2 (two) times daily.     ipratropium-albuterol 0.5-2.5 (3) MG/3ML Soln  Commonly known as:  DUONEB  Take 3 mLs by nebulization 4 (four) times daily.     losartan 100 MG tablet  Commonly known as:  COZAAR  Take 100 mg by mouth daily.     predniSONE 10 MG tablet  Commonly known as:  STERAPRED UNI-PAK  Take 6 tab po q daily for 3 days, then 5 tabs po q daily for 3 days,  then 4 tabs po q daily for 3 days, then 3 tabs po q daily for 3 days, then 2 tabs po q daily for 3 days, then 1 tab po q daily for 3 days then stop     rOPINIRole 2 MG tablet  Commonly known as:  REQUIP  Take 1 mg by mouth 2 (two) times daily. Takes  tablet daily at dinnertime and  tablet every night at bedtime.       No Known Allergies     Follow-up Information   Follow up with Sandrea Hughs, MD On 01/17/2013. (At 3:30 pm)    Specialty:  Pulmonary Disease   Contact information:  [address] [place] [address] [phone]       Follow up with [facility]. (please contact [facility] as soon as you arrive home to deliver oxygen.)    Contact information:   [address] [place] [address] [phone]        The results of significant diagnostics from this hospitalization (including imaging, microbiology, ancillary and laboratory) are listed below for reference.    Significant Diagnostic Studies: Dg Chest 2 View  01/08/2013   CLINICAL DATA:  Cough.  Short of breath.  Wheezing.  EXAM: CHEST  2 VIEW  COMPARISON:  None.  FINDINGS: Heart size is normal. There is calcification of the thoracic aorta up. The lungs show hyperinflation with areas of linear scarring. Markings are slightly more prominent the right lung base and there could be mild right base infiltrate. No effusions. No acute bony finding. Degenerative and postsurgical changes of the lumbar spine.  IMPRESSION: Chronic obstructive lung disease with hyperinflation and scarring. Cannot rule out minimal patchy infiltrate at the right lung base, though this may simply represent crowding of lung markings because of emphysema in the upper lungs.   Electronically Signed   By: Paulina FusiMark  Shogry M.D.   On: 01/08/2013 10:39    Microbiology: Recent Results (from the past 240 hour(s))  MRSA PCR SCREENING     Status: None   Collection Time    01/08/13  5:29 PM      Result Value Range Status    MRSA by PCR NEGATIVE  NEGATIVE Final   Comment:            The GeneXpert MRSA Assay (FDA     approved for NASAL specimens     only), is one component of a     comprehensive MRSA colonization     surveillance program. It is not     intended to diagnose MRSA     infection nor to guide or     monitor treatment for     MRSA infections.  CULTURE, BLOOD (ROUTINE X 2)     Status: None   Collection Time    01/08/13  9:41 PM      Result Value Range Status   Specimen Description BLOOD LEFT ARM   Final   Special Requests BOTTLES DRAWN AEROBIC AND ANAEROBIC 10CC EACH   Final   Culture  Setup Time     Final   Value: 01/09/2013 05:28     Performed at [facility]   Culture     Final   Value:        BLOOD CULTURE RECEIVED NO GROWTH TO DATE CULTURE WILL BE HELD FOR 5 DAYS BEFORE ISSUING A FINAL NEGATIVE REPORT     Performed at [facility]   Report Status PENDING   Incomplete  CULTURE, BLOOD (ROUTINE X 2)     Status: None   Collection Time    01/08/13  9:46 PM      Result Value Range Status   Specimen Description BLOOD RIGHT FOREARM   Final   Special Requests BOTTLES DRAWN AEROBIC ONLY 10CC   Final   Culture  Setup Time     Final   Value: 01/09/2013 05:28     Performed at [facility]   Culture     Final   Value:        BLOOD CULTURE RECEIVED NO GROWTH TO DATE CULTURE WILL BE HELD FOR 5 DAYS BEFORE ISSUING  A FINAL NEGATIVE REPORT     Performed at [facility]   Report Status PENDING   Incomplete  CULTURE, EXPECTORATED SPUTUM-ASSESSMENT     Status: None   Collection Time    01/09/13 12:58 PM      Result Value Range Status   Specimen Description SPUTUM   Final   Special Requests NONE   Final   Sputum evaluation     Final   Value: MICROSCOPIC FINDINGS SUGGEST THAT THIS SPECIMEN IS NOT REPRESENTATIVE OF LOWER RESPIRATORY SECRETIONS. PLEASE RECOLLECT.     CALLED TO SCHMIDT M RN 01/09/13 1333 COSTELLO B   Report Status 01/09/2013 FINAL   Final      Labs: Basic Metabolic Panel:  Recent Labs Lab 01/08/13 1010 01/09/13 0820  NA 140 140  K 4.6 4.7  CL 104 104  CO2 26 25  GLUCOSE 103* 134*  BUN 37* 40*  CREATININE 1.30 1.35  CALCIUM 9.4 8.7   Liver Function Tests: No results found for this basename: AST, ALT, ALKPHOS, BILITOT, PROT, ALBUMIN,  in the last 168 hours No results found for this basename: LIPASE, AMYLASE,  in the last 168 hours No results found for this basename: AMMONIA,  in the last 168 hours CBC:  Recent Labs Lab 01/08/13 1010 01/09/13 0820  WBC 12.4* 14.8*  NEUTROABS 10.9*  --   HGB 14.5 13.9  HCT 43.2 40.9  MCV 95.4 95.3  PLT 191 169   Cardiac Enzymes:  Recent Labs Lab 01/08/13 1010  TROPONINI <0.30   BNP: BNP (last 3 results)  Recent Labs  01/08/13 1010  PROBNP 139.4   CBG: No results found for this basename: GLUCAP,  in the last 168 hours     Signed:  Jeralyn Bennett  [facility] 01/11/2013, 11:16 AM

## 2013-01-15 LAB — CULTURE, BLOOD (ROUTINE X 2)
Culture: NO GROWTH
Culture: NO GROWTH

## 2013-01-17 ENCOUNTER — Ambulatory Visit (INDEPENDENT_AMBULATORY_CARE_PROVIDER_SITE_OTHER): Payer: Medicare Other | Admitting: Internal Medicine

## 2013-01-17 ENCOUNTER — Encounter: Payer: Self-pay | Admitting: Internal Medicine

## 2013-01-17 VITALS — BP 120/64 | HR 78 | Temp 98.2°F | Ht 67.0 in | Wt 141.8 lb

## 2013-01-17 DIAGNOSIS — F17200 Nicotine dependence, unspecified, uncomplicated: Secondary | ICD-10-CM

## 2013-01-17 DIAGNOSIS — J449 Chronic obstructive pulmonary disease, unspecified: Secondary | ICD-10-CM | POA: Insufficient documentation

## 2013-01-17 DIAGNOSIS — F17210 Nicotine dependence, cigarettes, uncomplicated: Secondary | ICD-10-CM | POA: Insufficient documentation

## 2013-01-17 MED ORDER — FLUTICASONE FUROATE-VILANTEROL 100-25 MCG/INH IN AEPB: 1.0000 | INHALATION_SPRAY | Freq: Every morning | RESPIRATORY_TRACT | Status: DC

## 2013-01-17 MED ORDER — ACLIDINIUM BROMIDE 400 MCG/ACT IN AEPB: 1.0000 | INHALATION_SPRAY | Freq: Two times a day (BID) | RESPIRATORY_TRACT | Status: DC

## 2013-01-17 NOTE — Patient Instructions (Addendum)
Plan A Breo one puff each am followed Tudorza one puff and repeat Tudorza   Plan B Only use your albuterol (proair) inhaler as a rescue medication to be used if you can't catch your breath by resting or doing a relaxed purse lip breathing pattern.  - The less you use it, the better it will work when you need it. - Ok to use up to 2 puffs every 4 hours if you must but call for immediate appointment if use goes up over your usual need - Don't leave home without it !!  (think of it like your spare tire for your car)  - Only use nebulizer if you already used proair and it doesn't work- ok to use it up to every 4 hours if needed    Please schedule a follow up office visit in 4 weeks, sooner if needed with pfts

## 2013-01-17 NOTE — Progress Notes (Signed)
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 Please follow up on blood pressures, I increased his norvasc from 5 to 10mg  PO q Daily Discharge Diagnoses:  Principal Problem:  COPD exacerbation  Active Problems:  CAP (community acquired pneumonia)  Essential hypertension, benign  Discharge Condition: Stable/improved  Diet recommendation: Heart healthy diet  Filed Weights    01/08/13 1359   Weight:  65.772 kg (145 lb)   History of present illness:  LEMAR BAKOS is a 80 y.o. male with Past medical history of COPD and hypertension.  The patient is coming from home.  the patient presented with complaints of shortness of breath and cough which has been ongoing since last 5 days progressively worsening. The cough he is describing is a dry cough without any expectoration and shortness of breath was initially on exertion and right now on rest as well. He denies any chest pain or fever or denies any nausea or vomiting or aspiration he denies any diarrhea or constipation there is no active bleeding. He denies any leg swelling also denies any orthopnea or PND.  He was apparently started on a prednisone taper few days ago which has made some improvements but not significantly. He denies any travel or recent sick contacts  Hospital Course:  Patient is a pleasant 80 year old gentleman with a past medical history of COPD, was admitted to the medicine service on 01/08/2013, presenting to the emergency department with complaints of shortness of breath cough and wheezing. Prior to presentation he had been on a course of steroids and antibiotics, despite this, patient  progressively worsening. He was initially admitted to the step down unit where he was started on IV steroids, nebulizers, and inhaled steroids. He was transferred out of the step down unit on 01/10/2013 given clinical improvement. Patient improved by 01/11/2013, and reported feeling well enough to go home. We did ambulated down the hallway, which she seemed to tolerate well however his oxygen saturations dropped to 82% on room air. He otherwise was afebrile, tolerating by mouth intake, as his wife present at bedside reported that he was doing much better and near his baseline. Patient was setup with home oxygen, 2 L, continuous, prior to discharge. I had an extensive talk with them regarding tobacco abuse cessation, unfortunately patient reporting that he was not ready to quit smoking. With this I strongly warned him about smoking and wearing supplemental oxygen. He verbalized his understanding and reassured me that he would remove his supplemental oxygen if he were to smoke. Patient was setup to followup with pulmonary medicine in 1 week.   01/17/2013 1st [facility] office visit/ Hevin Jeffcoat cc doe x 10 years and as good if not better than discharge status in terms of sob with adls and not walking more than around the house  But no more attacks of sob  on prednisone 40 mg daily still takes up to an hour to clear am mucus and advair not helping   Before admit: advair not consistently using it, duoneb maybe once a day, albuterol hfa  3-4 daily   No obvious day to day or daytime  variabilty or assoc  cp or chest tightness, subjective wheeze overt sinus or hb symptoms. No unusual exp hx or h/o childhood pna/ asthma or knowledge of premature birth.  Sleeping ok without nocturnal  or early am exacerbation  of respiratory  c/o's or need for noct saba. Also denies any obvious fluctuation of symptoms with weather or environmental changes or other aggravating or alleviating factors except as outlined above   Current  Medications, Allergies, Complete Past Medical History, Past Surgical History, Family History, and Social History were reviewed in [facility] electronic medical record.      Review of Systems  Constitutional: Negative for fever, chills, activity change, appetite change and unexpected weight change.  HENT: Negative for congestion, dental problem, postnasal drip, rhinorrhea, sneezing, sore throat, trouble swallowing and voice change.   Eyes: Negative for visual disturbance.  Respiratory: Positive for shortness of breath. Negative for cough and choking.   Cardiovascular: Negative for chest pain and leg swelling.  Gastrointestinal: Negative for nausea, vomiting and abdominal pain.  Genitourinary: Negative for difficulty urinating.  Musculoskeletal: Negative for arthralgias.  Skin: Negative for rash.  Psychiatric/Behavioral: Negative for behavioral problems and confusion.       Objective:   Physical Exam  amb hoarse wm  Wt Readings from Last 3 Encounters:  01/17/13 141 lb 12.8 oz (64.32 kg)  01/08/13 145 lb (65.772 kg)      HEENT mild turbinate edema.  Oropharynx no thrush or excess pnd or cobblestoning.  No JVD or cervical adenopathy. Mild accessory muscle hypertrophy. Trachea midline, nl thryroid. Chest was hyperinflated by percussion with diminished breath sounds and moderate increased exp time without wheeze. Hoover sign positive at mid inspiration. Regular rate and rhythm without murmur gallop or rub or increase P2 or edema.  Abd: no hsm, nl excursion. Ext warm without cyanosis or clubbing.     CT chest 12/11/12  C/w  PF       Assessment & Plan:

## 2013-01-17 NOTE — Assessment & Plan Note (Addendum)
DDX of  difficult airways managment all start with A and  include Adherence, Ace Inhibitors, Acid Reflux, Active Sinus Disease, Alpha 1 Antitripsin deficiency, Anxiety masquerading as Airways dz,  ABPA,  allergy(esp in young), Aspiration (esp in elderly), Adverse effects of DPI,  Active smokers, plus two Bs  = Bronchiectasis and Beta blocker use..and one C= CHF  Adherence is always the initial "prime suspect" and is a multilayered concern that requires a "trust but verify" approach in every patient - starting with knowing how to use medications, especially inhalers, correctly, keeping up with refills and understanding the fundamental difference between maintenance and prns vs those medications only taken for a very short course and then stopped and not refilled.  - The proper method of use, as well as anticipated side effects, of a metered-dose inhaler are discussed and demonstrated to the patient. Improved effectiveness after extensive coaching during this visit to a level of approximately  75%   ? Adverse effect of advair > try change to breo and tudorza  Active smoking > discussed separately   See instructions for specific recommendations which were reviewed directly with the patient who was given a copy with highlighter outlining the key components.

## 2013-01-17 NOTE — Assessment & Plan Note (Signed)

## 2013-02-04 ENCOUNTER — Telehealth: Payer: Self-pay | Admitting: Internal Medicine

## 2013-02-04 MED ORDER — FLUTICASONE FUROATE-VILANTEROL 100-25 MCG/INH IN AEPB: 1.0000 | INHALATION_SPRAY | Freq: Every morning | RESPIRATORY_TRACT | Status: DC

## 2013-02-04 NOTE — Telephone Encounter (Signed)
Pt aware samples left for pick up. Nothing further needed 

## 2013-02-18 ENCOUNTER — Other Ambulatory Visit: Payer: Self-pay | Admitting: Internal Medicine

## 2013-02-18 DIAGNOSIS — R0602 Shortness of breath: Secondary | ICD-10-CM

## 2013-02-19 ENCOUNTER — Ambulatory Visit (INDEPENDENT_AMBULATORY_CARE_PROVIDER_SITE_OTHER): Payer: Medicare Other | Admitting: Internal Medicine

## 2013-02-19 ENCOUNTER — Encounter: Payer: Self-pay | Admitting: Internal Medicine

## 2013-02-19 VITALS — BP 124/78 | HR 69 | Temp 97.7°F | Ht 65.5 in | Wt 141.0 lb

## 2013-02-19 DIAGNOSIS — F17200 Nicotine dependence, unspecified, uncomplicated: Secondary | ICD-10-CM

## 2013-02-19 DIAGNOSIS — J449 Chronic obstructive pulmonary disease, unspecified: Secondary | ICD-10-CM

## 2013-02-19 DIAGNOSIS — R0602 Shortness of breath: Secondary | ICD-10-CM

## 2013-02-19 LAB — PULMONARY FUNCTION TEST
DL/VA % pred: 72 %
DL/VA: 3.11 ml/min/mmHg/L
DLCO unc % pred: 65 %
DLCO unc: 17.2 ml/min/mmHg
FEF 25-75 POST: 0.63 L/s
FEF 25-75 Pre: 0.65 L/sec
FEF2575-%Change-Post: -2 %
FEF2575-%Pred-Post: 36 %
FEF2575-%Pred-Pre: 37 %
FEV1-%Change-Post: 1 %
FEV1-%PRED-POST: 61 %
FEV1-%Pred-Pre: 61 %
FEV1-POST: 1.5 L
FEV1-Pre: 1.48 L
FEV1FVC-%CHANGE-POST: 4 %
FEV1FVC-%Pred-Pre: 63 %
FEV6-%Change-Post: 0 %
FEV6-%Pred-Post: 95 %
FEV6-%Pred-Pre: 96 %
FEV6-Post: 3.03 L
FEV6-Pre: 3.05 L
FEV6FVC-%Change-Post: 2 %
FEV6FVC-%Pred-Post: 103 %
FEV6FVC-%Pred-Pre: 100 %
FVC-%CHANGE-POST: -3 %
FVC-%Pred-Post: 92 %
FVC-%Pred-Pre: 95 %
FVC-PRE: 3.25 L
FVC-Post: 3.14 L
PRE FEV1/FVC RATIO: 45 %
Post FEV1/FVC ratio: 48 %
Post FEV6/FVC ratio: 96 %
Pre FEV6/FVC Ratio: 94 %
RV % pred: 134 %
RV: 3.17 L
TLC % pred: 107 %
TLC: 6.58 L

## 2013-02-19 MED ORDER — ACLIDINIUM BROMIDE 400 MCG/ACT IN AEPB: 1.0000 | INHALATION_SPRAY | Freq: Every day | RESPIRATORY_TRACT | Status: DC

## 2013-02-19 MED ORDER — FLUTICASONE FUROATE-VILANTEROL 100-25 MCG/INH IN AEPB: 1.0000 | INHALATION_SPRAY | Freq: Two times a day (BID) | RESPIRATORY_TRACT | Status: DC

## 2013-02-19 NOTE — Patient Instructions (Addendum)
Try off breo but take tudorza one twice daily.  If get worse or start needing rescue inhaler (proair) then start back on breo once daily   The key is to stop smoking completely before smoking completely stops you - it's clearly not too late.   If you are satisfied with your treatment plan let your doctor know and he/she can either refill your medications or you can return here when your prescription runs out.     If in any way you are not 100% satisfied,  please tell us.  If 100% better, tell your friends!

## 2013-02-19 NOTE — Progress Notes (Signed)
PFT done today. 

## 2013-02-19 NOTE — Progress Notes (Signed)
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 male with Past medical history of COPD and hypertension.  The patient is coming from home.  the patient presented with complaints of shortness of breath and cough which has been ongoing since last 5 days progressively worsening. The cough he is describing is a dry cough without any expectoration and shortness of breath was initially on exertion and right now on rest as well. He denies any chest pain or fever or denies any nausea or vomiting or aspiration he denies any diarrhea or constipation there is no active bleeding. He denies any leg swelling also denies any orthopnea or PND.  He was apparently started on a prednisone taper few days ago which has made some improvements but not significantly. He denies any travel or recent sick contacts  Hospital Course:  Patient is a pleasant 80 year old gentleman with a past medical history of COPD, was admitted to the medicine service on 01/08/2013, presenting to the emergency department with complaints of shortness of breath cough and wheezing. Prior to presentation he had been on a course of steroids and antibiotics, despite this, patient progressively worsening. He was initially admitted to the step down unit where he was started on IV steroids,  nebulizers, and inhaled steroids. He was transferred out of the step down unit on 01/10/2013 given clinical improvement. Patient improved by 01/11/2013, and reported feeling well enough to go home. We did ambulated down the hallway, which she seemed to tolerate well however his oxygen saturations dropped to 82% on room air. He otherwise was afebrile, tolerating by mouth intake, as his wife present at bedside reported that he was doing much better and near his baseline. Patient was setup with home oxygen, 2 L, continuous, prior to discharge. I had an extensive talk with them regarding tobacco abuse cessation, unfortunately patient reporting that he was not ready to quit smoking. With this I strongly warned him about smoking and wearing supplemental oxygen. He verbalized his understanding and reassured me that he would remove his supplemental oxygen if he were to smoke. Patient was setup to followup with pulmonary medicine in 1 week.   01/17/2013 1st [facility] office visit/ Joe Gonzalez cc doe x 10 years and as good if not better than discharge status in terms of sob with adls and not walking more than around the house  But no more attacks of sob  on prednisone 40 mg daily still takes up to an hour to clear am mucus and advair not helping   Before admit: advair not consistently using it, duoneb maybe once a day, albuterol hfa  3-4 daily  rec Plan A Breo one puff each am followed Tudorza one puff and repeat Tudorza Plan B Only use your albuterol (proair) inhaler  02/19/2013 f/u ov/Joe Gonzalez re: copd, GOLD II  maint on breo and tudorza  Chief Complaint  Patient presents with  . Follow-up    Review PFT.  No breathing complaints at this time.   much better pverall, Not limited by breathing from desired activities  Or needing saba  No obvious day to day or daytime variabilty or assoc chronic cough or cp or chest tightness, subjective wheeze overt sinus or hb symptoms. No unusual exp hx or h/o childhood pna/  asthma or knowledge of premature birth.  Sleeping ok without nocturnal  or early am exacerbation  of respiratory  c/o's or need for noct saba. Also denies any obvious fluctuation of symptoms with weather or environmental changes or other aggravating or alleviating factors except as outlined above   Current Medications, Allergies, Complete Past Medical History, Past Surgical History, Family History, and Social History were reviewed in [facility] electronic medical record.  ROS  The following are not active complaints unless bolded sore throat, dysphagia, dental problems, itching, sneezing,  nasal congestion or excess/ purulent secretions, ear ache,   fever, chills, sweats, unintended wt loss, pleuritic or exertional cp, hemoptysis,  orthopnea pnd or leg swelling, presyncope, palpitations, heartburn, abdominal pain, anorexia, nausea, vomiting, diarrhea  or change in bowel or urinary habits, change in stools or urine, dysuria,hematuria,  rash, arthralgias, visual complaints, headache, numbness weakness or ataxia or problems with walking or coordination,  change in mood/affect or memory.                 Objective:   Physical Exam  amb min hoarse wm  Wt Readings from Last 3 Encounters:  02/19/13 141 lb (63.957 kg)  01/17/13 141 lb 12.8 oz (64.32 kg)  01/08/13 145 lb (65.772 kg)         HEENT mild turbinate edema.  Oropharynx no thrush or excess pnd or cobblestoning.  No JVD or cervical adenopathy. Mild accessory muscle hypertrophy. Trachea midline, nl thryroid. Chest was hyperinflated by percussion with diminished breath sounds and moderate increased exp time without wheeze. Hoover sign positive at mid inspiration. Regular rate and rhythm without murmur gallop or rub or increase P2 or edema.  Abd: no hsm, nl excursion. Ext warm without cyanosis or clubbing.     CT chest 12/11/12  C/w  PF       Assessment & Plan:

## 2013-02-20 NOTE — Assessment & Plan Note (Addendum)
-   started breo and tudorza 01/17/2013  - PFT's  02/19/2013   1.48 (61%) ratio 45 and DLCO 65% and corrects to 72   Adequate control on present rx despite ongoing smoking, reviewed > no change in rx needed      Each maintenance medication was reviewed in detail including most importantly the difference between maintenance and as needed and under what circumstances the prns are to be used.  Please see instructions for details which were reviewed in writing and the patient given a copy.

## 2013-02-20 NOTE — Assessment & Plan Note (Signed)
>   3 min I reviewed the Flethcher curve with patient that basically indicates  if you quit smoking when your best day FEV1 is still well preserved it is highly unlikely you will progress to severe disease and informed the patient there was no medication on the market that has proven to change the curve or the likelihood of progression.  Therefore stopping smoking and maintaining abstinence is the most important aspect of care, not choice of inhalers or for that matter, doctors.

## 2013-10-11 ENCOUNTER — Encounter: Payer: Self-pay | Admitting: Internal Medicine

## 2013-10-11 ENCOUNTER — Ambulatory Visit (INDEPENDENT_AMBULATORY_CARE_PROVIDER_SITE_OTHER)
Admission: RE | Admit: 2013-10-11 | Discharge: 2013-10-11 | Disposition: A | Discharge status: Home / Self Care | Payer: Medicare Other | Source: Ambulatory Visit | Attending: Internal Medicine | Admitting: Internal Medicine

## 2013-10-11 ENCOUNTER — Ambulatory Visit (INDEPENDENT_AMBULATORY_CARE_PROVIDER_SITE_OTHER): Payer: Medicare Other | Admitting: Internal Medicine

## 2013-10-11 VITALS — BP 134/72 | HR 73 | Temp 98.2°F | Ht 66.0 in | Wt 146.0 lb

## 2013-10-11 DIAGNOSIS — J449 Chronic obstructive pulmonary disease, unspecified: Secondary | ICD-10-CM

## 2013-10-11 DIAGNOSIS — Z23 Encounter for immunization: Secondary | ICD-10-CM

## 2013-10-11 MED ORDER — ACLIDINIUM BROMIDE 400 MCG/ACT IN AEPB: 1.0000 | INHALATION_SPRAY | Freq: Every day | RESPIRATORY_TRACT | Status: DC

## 2013-10-11 MED ORDER — PREDNISONE 10 MG PO TABS: ORAL_TABLET | ORAL | Status: DC

## 2013-10-11 NOTE — Patient Instructions (Signed)
Restart tudorza one twice daily  Breo once daily   Prednisone 10 mg take  4 each am x 2 days,   2 each am x 2 days,  1 each am x 2 days and stop   Please remember to go to the x-ray department downstairs for your tests - we will call you with the results when they are available.    Please schedule a follow up office visit in 4 weeks, sooner if needed bring your list of preferred respiratory medications with you

## 2013-10-11 NOTE — Progress Notes (Signed)
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 male with Past medical history of COPD and hypertension.  The patient is coming from home.  the patient presented with complaints of shortness of breath and cough which has been ongoing since last 5 days progressively worsening. The cough he is describing is a dry cough without any expectoration and shortness of breath was initially on exertion and right now on rest as well. He denies any chest pain or fever or denies any nausea or vomiting or aspiration he denies any diarrhea or constipation there is no active bleeding. He denies any leg swelling also denies any orthopnea or PND.  He was apparently started on a prednisone taper few days ago which has made some improvements but not significantly. He denies any travel or recent sick contacts  Hospital Course:  Patient is a pleasant 80 year old gentleman with a past medical history of COPD, was admitted to the medicine service on 01/08/2013, presenting to the emergency department with complaints of shortness of breath cough and wheezing. Prior to presentation he had been on a course of steroids and antibiotics, despite this, patient progressively worsening. He was initially admitted to the step down unit where he was started on IV steroids,  nebulizers, and inhaled steroids. He was transferred out of the step down unit on 01/10/2013 given clinical improvement. Patient improved by 01/11/2013, and reported feeling well enough to go home. We did ambulated down the hallway, which she seemed to tolerate well however his oxygen saturations dropped to 82% on room air. He otherwise was afebrile, tolerating by mouth intake, as his wife present at bedside reported that he was doing much better and near his baseline. Patient was setup with home oxygen, 2 L, continuous, prior to discharge. I had an extensive talk with them regarding tobacco abuse cessation, unfortunately patient reporting that he was not ready to quit smoking. With this I strongly warned him about smoking and wearing supplemental oxygen. He verbalized his understanding and reassured me that he would remove his supplemental oxygen if he were to smoke. Patient was setup to followup with pulmonary medicine in 1 week.   01/17/2013 1st  Pulmonary office visit/ Kristien Salatino cc doe x 10 years and as good if not better than discharge status in terms of sob with adls and not walking more than around the house  But no more attacks of sob  on prednisone 40 mg daily still takes up to an hour to clear am mucus and advair not helping   Before admit: advair not consistently using it, duoneb maybe once a day, albuterol hfa  3-4 daily  rec Plan A Breo one puff each am followed Tudorza one puff and repeat Tudorza Plan B Only use your albuterol (proair) inhaler  02/19/2013 f/u ov/Keita Demarco re: copd, GOLD II  maint on breo and tudorza  Chief Complaint  Patient presents with  . Follow-up    Review PFT.  No breathing complaints at this time.   much better overall, Not limited by breathing from desired activities  Or needing saba rec Try off breo but take tudorza one twice daily.  If get worse or start needing rescue inhaler (proair) then start back on breo once daily    10/11/2013 f/u ov/Sadrac Zeoli re: GOLD II  copd/ still smoking  Chief Complaint  Patient presents with  . Acute Visit    Pt c/o increased DOE with minimal exertion for the past month. Started after had a fall and fx a rib.  Using albuterol inhaler 2-3 times per day and also using neb 3-4 x per day.   walking flat ground ok but not doing much walking  Sob walking up uphill from barn better while on turdorza then switched to breo and duoneb and proair 3 -4 x per day and not as good of ex tol and   worse since fell  3 weeks prior to OV  With R rib pain but no fx per pt report of cxr - no longer having any pain with position or breathing.    No obvious day to day or daytime variabilty or assoc chronic cough or cp or chest tightness, subjective wheeze overt sinus or hb symptoms. No unusual exp hx or h/o childhood pna/ asthma or knowledge of premature birth.  Sleeping ok without nocturnal  or early am exacerbation  of respiratory  c/o's or need for noct saba. Also denies any obvious fluctuation of symptoms with weather or environmental changes or other aggravating or alleviating factors except as outlined above   Current Medications, Allergies, Complete Past Medical History, Past Surgical History, Family History, and Social History were reviewed in [facility] record.  ROS  The following are not active complaints unless bolded sore throat, dysphagia, dental problems, itching, sneezing,  nasal congestion or excess/ purulent secretions, ear ache,   fever, chills, sweats, unintended wt loss, pleuritic or exertional cp, hemoptysis,  orthopnea pnd or leg swelling, presyncope, palpitations, heartburn, abdominal pain, anorexia, nausea, vomiting, diarrhea  or change in bowel or urinary habits, change in stools or urine, dysuria,hematuria,  rash, arthralgias, visual complaints, headache, numbness weakness or ataxia or problems with walking or coordination,  change in mood/affect or memory.                 Objective:    Physical Exam  amb min hoarse wm  10/11/2013        146  Wt Readings from Last 3 Encounters:  02/19/13 141 lb (63.957 kg)  01/17/13 141 lb 12.8 oz (64.32 kg)  01/08/13 145 lb (65.772 kg)         HEENT mild turbinate edema.  Oropharynx no thrush or excess pnd or cobblestoning.  No JVD or cervical adenopathy. Mild accessory muscle hypertrophy. Trachea midline, nl thryroid. Chest was hyperinflated by percussion with diminished breath sounds and moderate increased exp time with bilateral mid exp  wheeze. Hoover sign positive at mid inspiration. Regular rate and rhythm without murmur gallop or rub or increase P2 or edema.  Abd: no hsm, nl excursion. Ext warm without cyanosis or clubbing.     CT chest 12/11/12  C/w  PF   CXR  10/11/2013 :  Underlying emphysema with areas of scarring bilaterally. No edema or  consolidation.  Assessment & Plan:

## 2013-10-12 NOTE — Assessment & Plan Note (Addendum)
-   started breo and tudorza 01/17/2013  - PFT's  02/19/2013   1.48 (61%) ratio 45 and DLCO 65% and corrects to 72   DDX of  difficult airways management all start with A and  include Adherence, Ace Inhibitors, Acid Reflux, Active Sinus Disease, Alpha 1 Antitripsin deficiency, Anxiety masquerading as Airways dz,  ABPA,  allergy(esp in young), Aspiration (esp in elderly), Adverse effects of DPI,  Active smokers, plus two Bs  = Bronchiectasis and Beta blocker use..and one C= CHF  Adherence is always the initial "prime suspect" and is a multilayered concern that requires a "trust but verify" approach in every patient - starting with knowing how to use medications, especially inhalers, correctly, keeping up with refills and understanding the fundamental difference between maintenance and prns vs those medications only taken for a very short course and then stopped and not refilled.  The proper method of use, as well as anticipated side effects, of a metered-dose inhaler are discussed and demonstrated to the patient. Improved effectiveness after extensive coaching during this visit to a level of approximately  90% with dpi tudorza > restart pending f/u with formulary alternatives  Active smoking > see sep a/p  ? Allergy > Prednisone 10 mg take  4 each am x 2 days,   2 each am x 2 days,  1 each am x 2 days and stop to see what if any short term benefit  ? Bb effect > doubt since on low dose tenormin but ideally   prefer in this setting: Bystolic, the most beta -1  selective Beta blocker available in sample form, with bisoprolol the most selective generic choice  on the market.     Each maintenance medication was reviewed in detail including most importantly the difference between maintenance and as needed and under what circumstances the prns are to be used.  Please see instructions for details which were reviewed in writing and the patient given a copy.

## 2013-10-14 NOTE — Progress Notes (Signed)
Quick Note:  lmtcb ______ 

## 2013-10-14 NOTE — Progress Notes (Signed)
Quick Note:  Spoke with pt and notified of results per Dr. Wert. Pt verbalized understanding and denied any questions.  ______ 

## 2013-11-11 ENCOUNTER — Ambulatory Visit (INDEPENDENT_AMBULATORY_CARE_PROVIDER_SITE_OTHER): Payer: Medicare Other | Admitting: Internal Medicine

## 2013-11-11 ENCOUNTER — Encounter: Payer: Self-pay | Admitting: Internal Medicine

## 2013-11-11 VITALS — BP 128/77 | HR 77 | Ht 67.0 in | Wt 149.6 lb

## 2013-11-11 DIAGNOSIS — J449 Chronic obstructive pulmonary disease, unspecified: Secondary | ICD-10-CM

## 2013-11-11 MED ORDER — UMECLIDINIUM-VILANTEROL 62.5-25 MCG/INH IN AEPB: INHALATION_SPRAY | RESPIRATORY_TRACT | Status: DC

## 2013-11-11 NOTE — Progress Notes (Signed)
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 male with Past medical history of COPD and hypertension.  The patient is coming from home.  the patient presented with complaints of shortness of breath and cough which has been ongoing since last 5 days progressively worsening. The cough he is describing is a dry cough without any expectoration and shortness of breath was initially on exertion and right now on rest as well. He denies any chest pain or fever or denies any nausea or vomiting or aspiration he denies any diarrhea or constipation there is no active bleeding. He denies any leg swelling also denies any orthopnea or PND.  He was apparently started on a prednisone taper few days ago which has made some improvements but not significantly. He denies any travel or recent sick contacts  Hospital Course:  Patient is a pleasant 80 year old gentleman with a past medical history of COPD, was admitted to the medicine service on 01/08/2013, presenting to the emergency department with complaints of shortness of breath cough and wheezing. Prior to presentation he had been on a course of steroids and antibiotics, despite this, patient progressively worsening. He was initially admitted to the step down unit where he was started on IV steroids,  nebulizers, and inhaled steroids. He was transferred out of the step down unit on 01/10/2013 given clinical improvement. Patient improved by 01/11/2013, and reported feeling well enough to go home. We did ambulated down the hallway, which she seemed to tolerate well however his oxygen saturations dropped to 82% on room air. He otherwise was afebrile, tolerating by mouth intake, as his wife present at bedside reported that he was doing much better and near his baseline. Patient was setup with home oxygen, 2 L, continuous, prior to discharge. I had an extensive talk with them regarding tobacco abuse cessation, unfortunately patient reporting that he was not ready to quit smoking. With this I strongly warned him about smoking and wearing supplemental oxygen. He verbalized his understanding and reassured me that he would remove his supplemental oxygen if he were to smoke. Patient was setup to followup with pulmonary medicine in 1 week.   01/17/2013 1st  Pulmonary office visit/ Wert cc doe x 10 years and as good if not better than discharge status in terms of sob with adls and not walking more than around the house  But no more attacks of sob  on prednisone 40 mg daily still takes up to an hour to clear am mucus and advair not helping   Before admit: advair not consistently using it, duoneb maybe once a day, albuterol hfa  3-4 daily  rec Plan A Breo one puff each am followed Tudorza one puff and repeat Tudorza Plan B Only use your albuterol (proair) inhaler  02/19/2013 f/u ov/Wert re: copd, GOLD II  maint on breo and tudorza  Chief Complaint  Patient presents with  . Follow-up    Review PFT.  No breathing complaints at this time.   much better overall, Not limited by breathing from desired activities  Or needing saba rec Try off breo but take tudorza one twice daily.  If get worse or start needing rescue inhaler (proair) then start back on breo once daily    10/11/2013 f/u ov/Wert re: GOLD II  copd/ still smoking  Chief Complaint  Patient presents with  . Acute Visit    Pt c/o increased DOE with minimal exertion for the past month. Started after had a fall and fx a rib.  Using albuterol inhaler 2-3 times per day and also using neb 3-4 x per day.   walking flat ground ok but not doing much walking  Sob walking up uphill from barn better while on turdorza then switched to breo and duoneb and proair 3 -4 x per day and not as good of ex tol and   worse since fell  3 weeks prior to OV  With R rib pain but no fx per pt report of cxr - no longer having any pain with position or breathing.  rec Restart tudorza one twice daily  Breo once daily  Prednisone 10 mg take  4 each am x 2 days,   2 each am x 2 days,  1 each am x 2 days and stop  Please remember to go to the x-ray department   11/11/2013 f/u ov/Wert re: COPD GOLD II Chief Complaint  Patient presents with  . Follow-up    Patient states breathing doing well. He may have sob with exhertion, no wheezing or cough.   Not limited by breathing from desired activities    No obvious day to day or daytime variabilty or assoc chronic cough or cp or chest tightness, subjective wheeze overt sinus or hb symptoms. No unusual exp hx or h/o childhood pna/ asthma or knowledge of premature birth.  Sleeping ok without nocturnal  or early am exacerbation  of respiratory  c/o's or need for noct saba. Also denies any obvious fluctuation of symptoms with weather or environmental changes or other aggravating or alleviating factors except as outlined above   Current Medications, Allergies, Complete Past Medical History, Past Surgical History, Family History, and Social History were reviewed in [facility] electronic medical record.  ROS  The following are not active complaints unless bolded sore throat, dysphagia, dental problems, itching, sneezing,  nasal congestion or excess/ purulent secretions, ear ache,   fever, chills, sweats, unintended wt loss,  pleuritic or exertional cp, hemoptysis,  orthopnea pnd or leg swelling, presyncope, palpitations, heartburn, abdominal pain, anorexia, nausea, vomiting, diarrhea  or change in bowel or urinary habits, change in stools or urine, dysuria,hematuria,  rash, arthralgias, visual complaints, headache, numbness weakness or ataxia or problems with walking or coordination,  change in mood/affect or memory.                 Objective:   Physical Exam  amb min hoarse wm  10/11/2013        146 > 11/11/2013  149  Wt Readings from Last 3 Encounters:  02/19/13 141 lb (63.957 kg)  01/17/13 141 lb 12.8 oz (64.32 kg)  01/08/13 145 lb (65.772 kg)         HEENT mild turbinate edema.  Oropharynx no thrush or excess pnd or cobblestoning.  No JVD or cervical adenopathy. Mild accessory muscle hypertrophy. Trachea midline, nl thryroid. Chest was hyperinflated by percussion with diminished breath sounds and moderate increased exp time with trace bilateral late  exp  wheezes. Hoover sign positive at mid inspiration. Regular rate and rhythm without murmur gallop or rub or increase P2 or edema.  Abd: no hsm, nl excursion. Ext warm without cyanosis or clubbing.     CT chest 12/11/12  C/w  PF   CXR  10/11/2013 :  Underlying emphysema with areas of scarring bilaterally. No edema or  consolidation.       Assessment & Plan:

## 2013-11-11 NOTE — Patient Instructions (Addendum)
Try off breo / tudorza and on anoro one daily   Only use your albuterol as a rescue medication to be used if you can't catch your breath by resting or doing a relaxed purse lip breathing pattern.  - The less you use it, the better it will work when you need it. - Ok to use up to 2 puffs  every 4 hours if you must but call for immediate appointment if use goes up over your usual need - Don't leave home without it !!  (think of it like the spare tire for your car)   Please schedule a follow up visit in 3 months but call sooner if needed

## 2013-11-11 NOTE — Assessment & Plan Note (Signed)
-   started breo and [place] 01/17/2013  > changed to anoro 11/11/2013 as clinically more fixed obst  - PFT's  02/19/2013   1.48 (61%) ratio 45 and DLCO 65% and corrects to 72    He is happy with his present rx but can't afford lama/laba/ics.  Of the three, the ICS in copd is the least important as long as no occult asthma or having more than one exac per year to reasonable to try anoro here since on his formulary and has the LABA/LAMA at a lower rate than the other two combined     Each maintenance medication was reviewed in detail including most importantly the difference between maintenance and as needed and under what circumstances the prns are to be used.  Please see instructions for details which were reviewed in writing and the patient given a copy.

## 2013-11-25 ENCOUNTER — Telehealth: Payer: Self-pay | Admitting: Internal Medicine

## 2013-11-25 DIAGNOSIS — J449 Chronic obstructive pulmonary disease, unspecified: Secondary | ICD-10-CM

## 2013-11-25 NOTE — Telephone Encounter (Signed)
lmomtcb x1 for [employer]

## 2013-11-26 NOTE — Telephone Encounter (Signed)
Fine with me to d/c 02  

## 2013-11-26 NOTE — Telephone Encounter (Signed)
I spoke with the pt and he states that back in Jan 2015 he was in the hospital and was sent home on oxygen. He states he has not used it for most of the year. He is asking for an order to be sent to discontinue the oxygen. [facility]

## 2013-11-26 NOTE — Telephone Encounter (Signed)
Patient calling back.  [phone]

## 2013-11-26 NOTE — Telephone Encounter (Signed)
[facility] Ree KidaJack went out to see pt because he'd had recent shoulder surgery (right shoulder)  Per pt's chart, home O2 was started at the 1.2.15 hospital [facility] to verify the above information

## 2013-11-26 NOTE — Telephone Encounter (Signed)
Order placed. Jennifer Castillo, CMA  

## 2014-02-10 ENCOUNTER — Ambulatory Visit (INDEPENDENT_AMBULATORY_CARE_PROVIDER_SITE_OTHER): Payer: Medicare Other | Admitting: Internal Medicine

## 2014-02-10 ENCOUNTER — Encounter: Payer: Self-pay | Admitting: Internal Medicine

## 2014-02-10 VITALS — BP 128/72 | HR 69 | Temp 97.6°F | Ht 67.0 in | Wt 149.2 lb

## 2014-02-10 DIAGNOSIS — Z720 Tobacco use: Secondary | ICD-10-CM

## 2014-02-10 DIAGNOSIS — J449 Chronic obstructive pulmonary disease, unspecified: Secondary | ICD-10-CM

## 2014-02-10 DIAGNOSIS — F17200 Nicotine dependence, unspecified, uncomplicated: Secondary | ICD-10-CM

## 2014-02-10 NOTE — Patient Instructions (Signed)
Continue automatic anoro one click x 2 drags daily   Only use your albuterol(proair/RED/emergency)  as a rescue medication to be used if you can't catch your breath by resting or doing a relaxed purse lip breathing pattern.  - The less you use it, the better it will work when you need it. - Ok to use up to 2 puffs  every 4 hours if you must but call for immediate appointment if use goes up over your usual need - Don't leave home without it !!  (think of it like the spare tire for your car)   The key is to stop smoking completely before smoking completely stops you!   Return in 6 months,call sooner if needed

## 2014-02-10 NOTE — Progress Notes (Signed)
   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 Follow-up    Denies any breathing issues at this time.   uses proair 1st thing in am and about 3 x daily total but never noct Not limited by breathing from desired activities  But rather by knees walks behind wife, still able to cross parking lots, do [employer] shopping    No obvious day to day or daytime variabilty or assoc chronic cough or cp or chest tightness, subjective wheeze overt sinus or hb symptoms. No unusual exp hx or h/o childhood pna/ asthma or knowledge of premature birth.  Sleeping ok without nocturnal  or early am exacerbation  of respiratory  c/o's or need for noct saba. Also denies any obvious fluctuation of symptoms with weather or environmental changes or other aggravating or alleviating factors except as outlined above   Current Medications, Allergies, Complete Past Medical History, Past Surgical History, Family History, and Social History were reviewed in [facility] electronic medical record.  ROS  The following are not active complaints unless bolded sore throat, dysphagia, dental problems, itching, sneezing,  nasal congestion or excess/ purulent secretions, ear ache,   fever, chills, sweats, unintended wt loss, pleuritic or exertional cp, hemoptysis,  orthopnea pnd or leg swelling, presyncope, palpitations, heartburn, abdominal pain,  anorexia, nausea, vomiting, diarrhea  or change in bowel or urinary habits, change in stools or urine, dysuria,hematuria,  rash, arthralgias, visual complaints, headache, numbness weakness or ataxia or problems with walking or coordination,  change in mood/affect or memory.                 Objective:   Physical Exam  amb min hoarse wm  10/11/2013        146 > 11/11/2013  149 > 02/10/2014   149  Wt Readings from Last 3 Encounters:  02/19/13 141 lb (63.957 kg)  01/17/13 141 lb 12.8 oz (64.32 kg)  01/08/13 145 lb (65.772 kg)         HEENT mild turbinate edema.  Oropharynx no thrush or excess pnd or cobblestoning.  No JVD or cervical adenopathy. Mild accessory muscle hypertrophy. Trachea midline, nl thryroid. Chest was hyperinflated by percussion with diminished breath sounds and moderate increased exp time with  Min end exp rhonchi.   Hoover sign positive at mid inspiration. Regular rate and rhythm without murmur gallop or rub or increase P2 or edema.  Abd: no hsm, nl excursion. Ext warm without cyanosis or clubbing.     CT chest 12/11/12  C/w  PF   CXR  10/11/2013 :  Underlying emphysema with areas of scarring bilaterally. No edema or  consolidation.       Assessment & Plan:

## 2014-02-12 ENCOUNTER — Encounter: Payer: Self-pay | Admitting: Internal Medicine

## 2014-02-12 NOTE — Assessment & Plan Note (Addendum)
started breo and [place] 01/17/2013  > changed to anoro 11/11/2013 as clinically more fixed obst  - PFT's  02/19/2013   1.48 (61%) ratio 45 and DLCO 65% and corrects to 72  - pt requested 02 be d/cd as hasn't used it in months and was rec by previoius hospitalist > d/c 11/26/2013  - 2/12016  Walked RA x 3 laps @ 185 ft each stopped due to  Knee pain, no desat, no sob @ moderate pace   Doing fine on anoro and really not limited by breathing at this point with smoking cessation most impt aspect of care now  The proper method of use, as well as anticipated side effects, of a metered-dose inhaler are discussed and demonstrated to the patient. Improved effectiveness after extensive coaching during this visit to a level of approximately  90% so ok to use saba hfa but instructed to call me if need increases over baseline    Each maintenance medication was reviewed in detail including most importantly the difference between maintenance and as needed and under what circumstances the prns are to be used.  Please see instructions for details which were reviewed in writing and the patient given a copy.

## 2014-02-12 NOTE — Assessment & Plan Note (Signed)
>   3 m I took an extended  opportunity with this patient to outline the consequences of continued cigarette use  in airway disorders based on all the data we have from the multiple national lung health studies (perfomed over decades at millions of dollars in cost)  indicating that smoking cessation, not choice of inhalers or physicians, is the most important aspect of care.    Not committed to quit at this time

## 2014-07-03 ENCOUNTER — Ambulatory Visit (INDEPENDENT_AMBULATORY_CARE_PROVIDER_SITE_OTHER): Payer: Medicare Other | Admitting: Internal Medicine

## 2014-07-03 ENCOUNTER — Encounter: Payer: Self-pay | Admitting: Internal Medicine

## 2014-07-03 VITALS — BP 122/68 | HR 81 | Ht 67.0 in | Wt 148.0 lb

## 2014-07-03 DIAGNOSIS — Z720 Tobacco use: Secondary | ICD-10-CM

## 2014-07-03 DIAGNOSIS — F17200 Nicotine dependence, unspecified, uncomplicated: Secondary | ICD-10-CM

## 2014-07-03 DIAGNOSIS — J449 Chronic obstructive pulmonary disease, unspecified: Secondary | ICD-10-CM

## 2014-07-03 MED ORDER — BUDESONIDE-FORMOTEROL FUMARATE 160-4.5 MCG/ACT IN AERO: INHALATION_SPRAY | RESPIRATORY_TRACT | Status: DC

## 2014-07-03 NOTE — Patient Instructions (Signed)
Symbicort 160 Take 2 puffs first thing in am and then another 2 puffs about 12 hours later.   Only use your albuterol Proair)  as a rescue medication to be used if you can't catch your breath by resting or doing a relaxed purse lip breathing pattern.  - The less you use it, the better it will work when you need it. - Ok to use up to 2 puffs  every 4 hours if you must but call for immediate appointment if use goes up over your usual need - Don't leave home without it !!  (think of it like the spare tire for your car)   Stop anoro   The key is to stop smoking completely before smoking completely stops you - this is by far the most important aspect of your care   Please schedule a follow up office visit in 4 weeks, sooner if needed with all medications you use in two bags - automatic vs as needed

## 2014-07-03 NOTE — Progress Notes (Signed)
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 Follow-up    Denies any breathing issues at this time.   uses proair 1st thing in am and about 3 x daily total but never noct rec Continue anoro   07/03/2014 f/u ov/Joe Gonzalez re: GOLD II COPD / anoro and prn proair  Chief Complaint  Patient presents with  . Acute Visit    Pt states that his breathing worsened 3-4 weeks ago. Pt c/o increased SOB with little exertion, chest tightness, cough and wheezing. Sone congestion noted- clear mucus. Denies SOB at rest.   sleeps fine but can't walk across the room s sob/ tried symb and helped but didn't stay on it "only worked while I was on it" / cough really not that bad  bad Every since exposure to the combine dust/ exhaust he's done poorly > was treated initially with pred "that's when my problem started" > apparently extensive cardiac w/u in [place] neg.      No obvious day to day or daytime variabilty or assoc excess or purulent mucus  or cp or  subjective wheeze overt sinus or hb symptoms. No unusual exp hx or h/o childhood pna/ asthma or knowledge of premature birth.  Sleeping ok without nocturnal  or early am exacerbation  of respiratory  c/o's or need for noct saba. Also denies any obvious fluctuation of symptoms with weather or environmental changes or other aggravating or alleviating factors except as outlined  above   Current Medications, Allergies, Complete Past Medical History, Past Surgical History, Family History, and Social History were reviewed in [facility] record.  ROS  The following are not active complaints unless bolded sore throat, dysphagia, dental problems, itching, sneezing,  nasal congestion or excess/ purulent secretions, ear ache,   fever, chills, sweats, unintended wt loss, pleuritic or exertional cp, hemoptysis,  orthopnea pnd or leg swelling, presyncope, palpitations, heartburn, abdominal pain, anorexia, nausea, vomiting, diarrhea  or change in bowel or urinary habits, change in stools or urine, dysuria,hematuria,  rash, arthralgias, visual complaints, headache, numbness weakness or ataxia or problems with walking or coordination,  change in mood/affect or memory.                 Objective:   Physical Exam  amb min hoarse wm/ hopeless affect   10/11/2013        146 > 11/11/2013  149 > 02/10/2014   149 > 07/03/2014  148  Wt Readings from Last 3 Encounters:  02/19/13 141 lb (63.957 kg)  01/17/13 141 lb 12.8 oz (64.32 kg)  01/08/13 145 lb (65.772 kg)       HEENT: nl dentition, turbinates, and orophanx. Nl external ear canals without cough reflex   NECK :  without JVD/Nodes/TM/ nl carotid upstrokes bilaterally   LUNGS: no acc muscle use,  insp and exp rhonchi bilaterally with upper airway component    CV:  RRR  no s3 or murmur or increase in P2, no edema   ABD:  soft and nontender with nl excursion in the supine position. No bruits or organomegaly, bowel sounds nl  MS:  warm without deformities, calf tenderness, cyanosis or clubbing  SKIN: warm and dry without lesions    NEURO:  alert, approp, no deficits         .       Assessment & Plan:

## 2014-07-04 ENCOUNTER — Encounter: Payer: Self-pay | Admitting: Internal Medicine

## 2014-07-04 NOTE — Assessment & Plan Note (Signed)
>   10 min discussion  I emphasized that although we never turn away smokers from the pulmonary clinic, we do ask that they understand that the recommendations that we make  won't work nearly as well in the presence of continued cigarette exposure.  In fact, we may very well  reach a point where we can't promise to help the patient if he/she can't quit smoking. (We can and will promise to try to help, we just can't promise what we recommend will really work)   He justifies his smoking by telling stories of vairious friends who "all died w/in 40 y of stopping cigarettes"  - I tried to explain it was likely they stopped smoking when smoking stopped them first, but I got no where because "his father smoked til 48 and was fine" so I doubt I convinced him

## 2014-07-04 NOTE — Assessment & Plan Note (Addendum)
-   started breo and [place] 01/17/2013  > changed to anoro 11/11/2013 as clinically more fixed obst  - PFT's  02/19/2013   1.48 (61%) ratio 45 and DLCO 65% and corrects to 72  - pt requested 02 be d/cd as hasn't used it in months and was rec by previoius hospitalist > d/c 11/26/2013  - 2/12016  Walked RA x 3 laps @ 185 ft each stopped due to  Knee pain, no desat, no sob @ moderate pace     DDX of  difficult airways management all start with A and  include Adherence, Ace Inhibitors, Acid Reflux, Active Sinus Disease, Alpha 1 Antitripsin deficiency, Anxiety masquerading as Airways dz,  ABPA,  allergy(esp in young), Aspiration (esp in elderly), Adverse effects of meds,  Active smokers, A bunch of PE's (a small clot burden can't cause this syndrome unless there is already severe underlying pulm or vascular dz with poor reserve) plus two Bs  = Bronchiectasis and Beta blocker use..and one C= CHF   Adherence is always the initial "prime suspect" and is a multilayered concern that requires a "trust but verify" approach in every patient - starting with knowing how to use medications, especially inhalers, correctly, keeping up with refills and understanding the fundamental difference between maintenance and prns vs those medications only taken for a very short course and then stopped and not refilled.  - The proper method of use, as well as anticipated side effects, of a metered-dose inhaler are discussed and demonstrated to the patient. Improved effectiveness after extensive coaching during this visit to a level of approximately  90% > try symbicort 160 2bid   Active smoking > see sep a/p  Adverse effects of dpi > try off dpi and on hfa  ? Allergy > doubt but ICS in symbicort should help and prev pred rx apparently backfired re systemic effects. His hfa is so good he shouldn't need it anyway.   ? Acid (or non-acid) GERD > always difficult to exclude as up to 75% of pts in some series report no assoc GI/  Heartburn symptoms> consider next max (24h)  acid suppression and diet restrictions/ reviewed and instructions given in writing.   ? Acei/ losartan > may need to consider next ov   ? Beta blockers > tenorminlow dose should be ok   ? CHF > excluded per pt    I had an extended discussion with the patient reviewing all relevant studies completed to date and  lasting 15 to 20 minutes of a 25 minute visit on the following ongoing concerns:  - Each maintenance medication was reviewed in detail including most importantly the difference between maintenance and as needed and under what circumstances the prns are to be used.  Please see instructions for details which were reviewed in writing and the patient given a copy.   - requested to bring all meds next ov separated maint vs prns.

## 2014-08-01 ENCOUNTER — Ambulatory Visit (INDEPENDENT_AMBULATORY_CARE_PROVIDER_SITE_OTHER): Payer: Medicare Other | Admitting: Internal Medicine

## 2014-08-01 ENCOUNTER — Encounter: Payer: Self-pay | Admitting: Internal Medicine

## 2014-08-01 VITALS — BP 112/74 | HR 76 | Ht 67.0 in | Wt 144.0 lb

## 2014-08-01 DIAGNOSIS — I10 Essential (primary) hypertension: Secondary | ICD-10-CM | POA: Diagnosis not present

## 2014-08-01 DIAGNOSIS — F17210 Nicotine dependence, cigarettes, uncomplicated: Secondary | ICD-10-CM

## 2014-08-01 DIAGNOSIS — J449 Chronic obstructive pulmonary disease, unspecified: Secondary | ICD-10-CM

## 2014-08-01 DIAGNOSIS — Z720 Tobacco use: Secondary | ICD-10-CM | POA: Diagnosis not present

## 2014-08-01 NOTE — Patient Instructions (Signed)
Prefer that change out atenolol to a similar drug called bisoprolol which does not interfere with the symbicort or your proair  Please schedule a follow up visit in 3 months but call sooner if needed

## 2014-08-01 NOTE — Progress Notes (Signed)
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 Follow-up    Denies any breathing issues at this time.   uses proair 1st thing in am and about 3 x daily total but never noct rec Continue anoro   07/03/2014 f/u ov/Joe Gonzalez re: GOLD II COPD / anoro and prn proair  Chief Complaint  Patient presents with  . Acute Visit    Pt states that his breathing worsened 3-4 weeks ago. Pt c/o increased SOB with little exertion, chest tightness, cough and wheezing. Sone congestion noted- clear mucus. Denies SOB at rest.   sleeps fine but can't walk across the room s sob/ tried symb and helped but didn't stay on it "only worked while I was on it" / cough really not that bad  bad Every since exposure to the combine dust/ exhaust he's done poorly > was treated initially with pred "that's when my problem started" > apparently extensive cardiac w/u in [place] neg. rec Symbicort 160 Take 2 puffs first thing in am and then another 2 puffs about 12 hours later.  Only use your albuterol Proair)  as a rescue medication   Stop anoro  The key is to stop smoking completely before smoking completely stops you - this is by far the most important aspect of your care  Please schedule a follow up office visit in 4 weeks, sooner if needed with all medications you use in two bags - automatic vs as needed      08/01/2014 f/u ov/Joe Gonzalez re: copd  II/ brought all meds  Chief Complaint  Patient presents with  . Follow-up    Pt states his breathing has improved some. He is still c/o some chest tightness, wheezing and cough but states these symptoms have improved some since the last visit. He is using proair approx 3 x per day.   less doe / never sob at rest or noct     No obvious day to day or daytime variabilty or assoc excess or purulent mucus  or cp or  subjective wheeze overt sinus or hb symptoms. No unusual exp hx or h/o childhood pna/ asthma or knowledge of premature birth.  Sleeping ok without nocturnal  or early am exacerbation  of respiratory  c/o's or need for noct saba. Also denies any obvious fluctuation of symptoms with weather or environmental changes or other aggravating or alleviating factors except as outlined above   Current Medications, Allergies, Complete Past Medical History, Past Surgical History, Family History, and Social History were reviewed in [employer] record.  ROS  The following are not active complaints unless bolded sore throat, dysphagia, dental problems, itching, sneezing,  nasal congestion or excess/ purulent secretions, ear ache,   fever, chills, sweats, unintended wt loss, pleuritic or exertional cp, hemoptysis,  orthopnea pnd or leg swelling, presyncope, palpitations,  heartburn, abdominal pain, anorexia, nausea, vomiting, diarrhea  or change in bowel or urinary habits, change in stools or urine, dysuria,hematuria,  rash, arthralgias, visual complaints, headache, numbness weakness or ataxia or problems with walking or coordination,  change in mood/affect or memory.                 Objective:   Physical Exam  amb min hoarse wm nad/ brighter affect    10/11/2013        146 > 11/11/2013  149 > 02/10/2014   149 > 07/03/2014  148 > 08/01/2014   144  Wt Readings from Last 3 Encounters:  02/19/13 141 lb (63.957 kg)  01/17/13 141 lb 12.8 oz (64.32 kg)  01/08/13 145 lb (65.772 kg)        HEENT: nl dentition, turbinates, and orophanx. Nl external ear canals without cough reflex   NECK :  without JVD/Nodes/TM/ nl carotid upstrokes bilaterally   LUNGS: no acc muscle use, min  insp and exp rhonchi bilaterally    CV:  RRR  no s3 or murmur or increase in P2, no edema   ABD:  soft and nontender with nl excursion in the supine position. No bruits or organomegaly, bowel sounds nl  MS:  warm without deformities, calf tenderness, cyanosis or clubbing  SKIN: warm and dry without lesions    NEURO:  alert, approp, no deficits         .       Assessment & Plan:

## 2014-08-02 ENCOUNTER — Encounter: Payer: Self-pay | Admitting: Internal Medicine

## 2014-08-02 NOTE — Assessment & Plan Note (Signed)
-   started breo and [place] 01/17/2013  > changed to anoro 11/11/2013 as clinically more fixed obst  - PFT's  02/19/2013   1.48 (61%) ratio 45 and DLCO 65% and corrects to 72  - pt requested 02 be d/cd as hasn't used it in months and was rec by previoius hospitalist > d/c 11/26/2013  - 2/12016  Walked RA x 3 laps @ 185 ft each stopped due to  Knee pain, no desat, no sob @ moderate pace  - 07/03/2014 p extensive coaching HFA effectiveness =    90%> symbicort 160 2bid   Finally turning the corner in terms of symptom control despite continue smoking, discussed separately      Each maintenance medication was reviewed in detail including most importantly the difference between maintenance and prns and under what circumstances the prns are to be triggered using an action plan format that is not reflected in the computer generated alphabetically organized AVS.    Please see instructions for details which were reviewed in writing and the patient given a copy highlighting the part that I personally wrote and discussed at today's ov.

## 2014-08-02 NOTE — Assessment & Plan Note (Signed)
Adequate control on present rx, reviewed > no change in rx needed for now on atenolol but ideally prefer in this setting: Bystolic, the most beta -1  selective Beta blocker available in sample form, with bisoprolol the most selective generic choice  on the market.   Defer final decision to primary care

## 2014-08-02 NOTE — Assessment & Plan Note (Signed)
Still not willing to consider stopping smoking "you might as well just shoot me"

## 2014-08-14 ENCOUNTER — Telehealth: Payer: Self-pay | Admitting: Internal Medicine

## 2014-08-14 MED ORDER — BUDESONIDE-FORMOTEROL FUMARATE 160-4.5 MCG/ACT IN AERO: INHALATION_SPRAY | RESPIRATORY_TRACT | Status: AC

## 2014-08-14 NOTE — Telephone Encounter (Signed)
Called pharmacy, pt requesting symbicort refill.  This has been sent in.  Nothing further needed.

## 2014-10-24 ENCOUNTER — Encounter (HOSPITAL_COMMUNITY): Payer: Self-pay | Admitting: Family Medicine

## 2014-10-24 ENCOUNTER — Emergency Department (HOSPITAL_BASED_OUTPATIENT_CLINIC_OR_DEPARTMENT_OTHER): Payer: Medicare Other

## 2014-10-24 ENCOUNTER — Inpatient Hospital Stay (HOSPITAL_BASED_OUTPATIENT_CLINIC_OR_DEPARTMENT_OTHER)
Admission: EM | Admit: 2014-10-24 | Discharge: 2014-10-26 | DRG: 190 | Disposition: A | Discharge status: Home / Self Care | Payer: Medicare Other | Attending: Internal Medicine | Admitting: Internal Medicine

## 2014-10-24 ENCOUNTER — Other Ambulatory Visit: Payer: Self-pay

## 2014-10-24 DIAGNOSIS — N179 Acute kidney failure, unspecified: Secondary | ICD-10-CM | POA: Diagnosis present

## 2014-10-24 DIAGNOSIS — F17210 Nicotine dependence, cigarettes, uncomplicated: Secondary | ICD-10-CM | POA: Diagnosis present

## 2014-10-24 DIAGNOSIS — J9600 Acute respiratory failure, unspecified whether with hypoxia or hypercapnia: Secondary | ICD-10-CM | POA: Diagnosis present

## 2014-10-24 DIAGNOSIS — Z720 Tobacco use: Secondary | ICD-10-CM | POA: Diagnosis not present

## 2014-10-24 DIAGNOSIS — J441 Chronic obstructive pulmonary disease with (acute) exacerbation: Principal | ICD-10-CM | POA: Diagnosis present

## 2014-10-24 DIAGNOSIS — J449 Chronic obstructive pulmonary disease, unspecified: Secondary | ICD-10-CM | POA: Diagnosis present

## 2014-10-24 DIAGNOSIS — J9601 Acute respiratory failure with hypoxia: Secondary | ICD-10-CM | POA: Diagnosis present

## 2014-10-24 DIAGNOSIS — E872 Acidosis: Secondary | ICD-10-CM | POA: Diagnosis present

## 2014-10-24 DIAGNOSIS — G2581 Restless legs syndrome: Secondary | ICD-10-CM | POA: Diagnosis present

## 2014-10-24 DIAGNOSIS — J9602 Acute respiratory failure with hypercapnia: Secondary | ICD-10-CM | POA: Diagnosis present

## 2014-10-24 DIAGNOSIS — I10 Essential (primary) hypertension: Secondary | ICD-10-CM | POA: Diagnosis present

## 2014-10-24 DIAGNOSIS — I447 Left bundle-branch block, unspecified: Secondary | ICD-10-CM | POA: Diagnosis present

## 2014-10-24 LAB — COMPREHENSIVE METABOLIC PANEL
ALT: 26 U/L (ref 17–63)
AST: 29 U/L (ref 15–41)
Albumin: 4.3 g/dL (ref 3.5–5.0)
Alkaline Phosphatase: 66 U/L (ref 38–126)
Anion gap: 5 (ref 5–15)
BILIRUBIN TOTAL: 0.9 mg/dL (ref 0.3–1.2)
BUN: 29 mg/dL — AB (ref 6–20)
CO2: 28 mmol/L (ref 22–32)
Calcium: 9.2 mg/dL (ref 8.9–10.3)
Chloride: 110 mmol/L (ref 101–111)
Creatinine, Ser: 1.04 mg/dL (ref 0.61–1.24)
GFR calc Af Amer: >60 mL/min (ref >60)
GLUCOSE: 144 mg/dL — AB (ref 65–99)
POTASSIUM: 4.3 mmol/L (ref 3.5–5.1)
Sodium: 143 mmol/L (ref 135–145)
TOTAL PROTEIN: 7.2 g/dL (ref 6.5–8.1)

## 2014-10-24 LAB — MRSA PCR SCREENING: MRSA by PCR: NEGATIVE

## 2014-10-24 LAB — CBC
HEMATOCRIT: 44.8 % (ref 39.0–52.0)
Hemoglobin: 14.2 g/dL (ref 13.0–17.0)
MCH: 31.5 pg (ref 26.0–34.0)
MCHC: 31.7 g/dL (ref 30.0–36.0)
MCV: 99.3 fL (ref 78.0–100.0)
PLATELETS: 145 10*3/uL — AB (ref 150–400)
RBC: 4.51 MIL/uL (ref 4.22–5.81)
RDW: 14.5 % (ref 11.5–15.5)
WBC: 11.1 10*3/uL — ABNORMAL HIGH (ref 4.0–10.5)

## 2014-10-24 LAB — I-STAT ARTERIAL BLOOD GAS, ED
ACID-BASE DEFICIT: 3 mmol/L — AB (ref 0.0–2.0)
Acid-base deficit: 3 mmol/L — ABNORMAL HIGH (ref 0.0–2.0)
BICARBONATE: 26.1 meq/L — AB (ref 20.0–24.0)
Bicarbonate: 27.1 mEq/L — ABNORMAL HIGH (ref 20.0–24.0)
O2 SAT: 100 %
O2 SAT: 100 %
PCO2 ART: 71.6 mmHg — AB (ref 35.0–45.0)
PCO2 ART: 59.5 mmHg — AB (ref 35.0–45.0)
PH ART: 7.185 — AB (ref 7.350–7.450)
PH ART: 7.249 — AB (ref 7.350–7.450)
Patient temperature: 98
Patient temperature: 98
TCO2: 29 mmol/L (ref 0–100)
TCO2: 28 mmol/L (ref 0–100)
pO2, Arterial: 474 mmHg — ABNORMAL HIGH (ref 80.0–100.0)
pO2, Arterial: 215 mmHg — ABNORMAL HIGH (ref 80.0–100.0)

## 2014-10-24 LAB — POCT I-STAT 3, ART BLOOD GAS (G3+)
Acid-base deficit: 5 mmol/L — ABNORMAL HIGH (ref 0.0–2.0)
BICARBONATE: 22.4 meq/L (ref 20.0–24.0)
O2 Saturation: 95 %
PCO2 ART: 50.7 mmHg — AB (ref 35.0–45.0)
PO2 ART: 89 mmHg (ref 80.0–100.0)
Patient temperature: 98.6
TCO2: 24 mmol/L (ref 0–100)
pH, Arterial: 7.254 — ABNORMAL LOW (ref 7.350–7.450)

## 2014-10-24 LAB — CBC WITH DIFFERENTIAL/PLATELET
BAND NEUTROPHILS: 0 %
BASOS ABS: 0 10*3/uL (ref 0.0–0.1)
BASOS PCT: 0 %
Blasts: 0 %
EOS ABS: 0.8 10*3/uL — AB (ref 0.0–0.7)
EOS PCT: 4 %
HCT: 48.3 % (ref 39.0–52.0)
Hemoglobin: 15.9 g/dL (ref 13.0–17.0)
LYMPHS ABS: 1.9 10*3/uL (ref 0.7–4.0)
LYMPHS PCT: 10 %
MCH: 32.2 pg (ref 26.0–34.0)
MCHC: 32.9 g/dL (ref 30.0–36.0)
MCV: 97.8 fL (ref 78.0–100.0)
METAMYELOCYTES PCT: 0 %
MONO ABS: 2.7 10*3/uL — AB (ref 0.1–1.0)
MONOS PCT: 14 %
Myelocytes: 0 %
NEUTROS ABS: 13.7 10*3/uL — AB (ref 1.7–7.7)
Neutrophils Relative %: 72 %
OTHER: 0 %
PLATELETS: 191 10*3/uL (ref 150–400)
Promyelocytes Absolute: 0 %
RBC: 4.94 MIL/uL (ref 4.22–5.81)
RDW: 14.2 % (ref 11.5–15.5)
WBC: 19.1 10*3/uL — ABNORMAL HIGH (ref 4.0–10.5)
nRBC: 0 /100 WBC

## 2014-10-24 LAB — BRAIN NATRIURETIC PEPTIDE: B Natriuretic Peptide: 71.9 pg/mL (ref 0.0–100.0)

## 2014-10-24 LAB — CREATININE, SERUM
Creatinine, Ser: 1.68 mg/dL — ABNORMAL HIGH (ref 0.61–1.24)
GFR calc non Af Amer: 37 mL/min — ABNORMAL LOW (ref >60)
GFR, EST AFRICAN AMERICAN: 43 mL/min — AB (ref >60)

## 2014-10-24 LAB — PROCALCITONIN: PROCALCITONIN: 2.55 ng/mL

## 2014-10-24 LAB — TROPONIN I: Troponin I: <0.03 ng/mL (ref <0.031)

## 2014-10-24 MED ORDER — ROPINIROLE HCL 1 MG PO TABS: 1.0000 mg | ORAL_TABLET | Freq: Two times a day (BID) | ORAL | Status: DC

## 2014-10-24 MED ORDER — ALBUTEROL SULFATE (2.5 MG/3ML) 0.083% IN NEBU
2.5000 mg | INHALATION_SOLUTION | Freq: Four times a day (QID) | RESPIRATORY_TRACT | Status: DC
  Administered 2014-10-25 (×2): 2.5 mg via RESPIRATORY_TRACT
  Filled 2014-10-24 (×2): qty 3

## 2014-10-24 MED ORDER — SUCCINYLCHOLINE CHLORIDE 20 MG/ML IJ SOLN
INTRAMUSCULAR | Status: AC
  Filled 2014-10-24: qty 1

## 2014-10-24 MED ORDER — MAGNESIUM SULFATE 50 % IJ SOLN: 1.0000 g | Freq: Once | INTRAMUSCULAR | Status: DC

## 2014-10-24 MED ORDER — ROCURONIUM BROMIDE 50 MG/5ML IV SOLN
INTRAVENOUS | Status: AC
  Filled 2014-10-24: qty 2

## 2014-10-24 MED ORDER — IPRATROPIUM-ALBUTEROL 0.5-2.5 (3) MG/3ML IN SOLN: 3.0000 mL | Freq: Four times a day (QID) | RESPIRATORY_TRACT | Status: DC

## 2014-10-24 MED ORDER — IPRATROPIUM-ALBUTEROL 0.5-2.5 (3) MG/3ML IN SOLN
RESPIRATORY_TRACT | Status: AC
  Filled 2014-10-24: qty 3

## 2014-10-24 MED ORDER — LIDOCAINE HCL (CARDIAC) 20 MG/ML IV SOLN
INTRAVENOUS | Status: AC
  Filled 2014-10-24: qty 5

## 2014-10-24 MED ORDER — ENOXAPARIN SODIUM 40 MG/0.4ML ~~LOC~~ SOLN: 40.0000 mg | SUBCUTANEOUS | Status: DC

## 2014-10-24 MED ORDER — ONDANSETRON HCL 4 MG/2ML IJ SOLN
4.0000 mg | Freq: Once | INTRAMUSCULAR | Status: AC
  Administered 2014-10-24: 4 mg via INTRAVENOUS
  Filled 2014-10-24: qty 2

## 2014-10-24 MED ORDER — SODIUM CHLORIDE 0.9 % IJ SOLN
3.0000 mL | Freq: Two times a day (BID) | INTRAMUSCULAR | Status: DC
  Administered 2014-10-25 – 2014-10-26 (×3): 3 mL via INTRAVENOUS

## 2014-10-24 MED ORDER — ETOMIDATE 2 MG/ML IV SOLN
INTRAVENOUS | Status: AC
  Filled 2014-10-24: qty 20

## 2014-10-24 MED ORDER — ALBUTEROL (5 MG/ML) CONTINUOUS INHALATION SOLN: 15.0000 mg/h | INHALATION_SOLUTION | Freq: Once | RESPIRATORY_TRACT | Status: AC

## 2014-10-24 MED ORDER — ALBUTEROL (5 MG/ML) CONTINUOUS INHALATION SOLN
INHALATION_SOLUTION | RESPIRATORY_TRACT | Status: AC
  Administered 2014-10-24: 15 mg/h
  Filled 2014-10-24: qty 20

## 2014-10-24 MED ORDER — AMLODIPINE BESYLATE 5 MG PO TABS
5.0000 mg | ORAL_TABLET | Freq: Every day | ORAL | Status: DC
  Administered 2014-10-25 – 2014-10-26 (×2): 5 mg via ORAL
  Filled 2014-10-24 (×2): qty 1

## 2014-10-24 MED ORDER — METHYLPREDNISOLONE SODIUM SUCC 125 MG IJ SOLR
125.0000 mg | Freq: Once | INTRAMUSCULAR | Status: AC
  Administered 2014-10-24: 125 mg via INTRAVENOUS
  Filled 2014-10-24: qty 2

## 2014-10-24 MED ORDER — IPRATROPIUM BROMIDE 0.02 % IN SOLN
0.5000 mg | Freq: Once | RESPIRATORY_TRACT | Status: DC
  Filled 2014-10-24: qty 2.5

## 2014-10-24 MED ORDER — ALBUTEROL SULFATE (2.5 MG/3ML) 0.083% IN NEBU
INHALATION_SOLUTION | RESPIRATORY_TRACT | Status: AC
  Administered 2014-10-24: 2.5 mg via RESPIRATORY_TRACT
  Filled 2014-10-24: qty 3

## 2014-10-24 MED ORDER — SODIUM CHLORIDE 0.9 % IV SOLN
INTRAVENOUS | Status: DC
  Administered 2014-10-24 – 2014-10-26 (×2): via INTRAVENOUS

## 2014-10-24 MED ORDER — TRAMADOL HCL 50 MG PO TABS: 50.0000 mg | ORAL_TABLET | Freq: Three times a day (TID) | ORAL | Status: DC | PRN

## 2014-10-24 MED ORDER — MAGNESIUM SULFATE 2 GM/50ML IV SOLN
INTRAVENOUS | Status: AC
  Filled 2014-10-24: qty 50

## 2014-10-24 MED ORDER — ROPINIROLE HCL 1 MG PO TABS
1.0000 mg | ORAL_TABLET | Freq: Two times a day (BID) | ORAL | Status: DC
  Administered 2014-10-24 – 2014-10-26 (×4): 1 mg via ORAL
  Filled 2014-10-24 (×4): qty 1

## 2014-10-24 MED ORDER — BUDESONIDE-FORMOTEROL FUMARATE 160-4.5 MCG/ACT IN AERO
2.0000 | INHALATION_SPRAY | Freq: Two times a day (BID) | RESPIRATORY_TRACT | Status: DC
  Administered 2014-10-25 – 2014-10-26 (×3): 2 via RESPIRATORY_TRACT
  Filled 2014-10-24 (×2): qty 6

## 2014-10-24 MED ORDER — MAGNESIUM SULFATE 50 % IJ SOLN
INTRAMUSCULAR | Status: AC
  Administered 2014-10-24: 1 g
  Filled 2014-10-24: qty 2

## 2014-10-24 MED ORDER — ALBUTEROL SULFATE (2.5 MG/3ML) 0.083% IN NEBU
2.5000 mg | INHALATION_SOLUTION | RESPIRATORY_TRACT | Status: DC | PRN
  Administered 2014-10-24: 2.5 mg via RESPIRATORY_TRACT

## 2014-10-24 MED ORDER — IPRATROPIUM-ALBUTEROL 0.5-2.5 (3) MG/3ML IN SOLN
RESPIRATORY_TRACT | Status: AC
Start: 2014-10-24 — End: 2014-10-24
  Administered 2014-10-24: 3 mL
  Filled 2014-10-24: qty 3

## 2014-10-24 MED ORDER — MAGNESIUM SULFATE IN D5W 10-5 MG/ML-% IV SOLN
1.0000 g | Freq: Once | INTRAVENOUS | Status: AC
  Filled 2014-10-24: qty 100

## 2014-10-24 NOTE — ED Notes (Signed)
L Radial x 1 attempt. No complications noted. No complications noted.

## 2014-10-24 NOTE — ED Notes (Signed)
 15mg  Albuterol CAT completed.

## 2014-10-24 NOTE — ED Notes (Signed)
Spoke to pt wife about pt's past medical hx and recent activities. Pt with hx of COPD and asthma, wife states she has never heard any provider state that he has emphysema. She denies any cardiac issues including MI. She states today the pt was outside mowing for "most of the morning" and he began feeling bad when he returned inside afterward.

## 2014-10-24 NOTE — ED Provider Notes (Addendum)
CSN: [id number]     Arrival date & time 10/24/14  1357 History   First MD Initiated Contact with Patient 10/24/14 1357     Chief Complaint  Patient presents with  . Respiratory Distress     (Consider location/radiation/quality/duration/timing/severity/associated sxs/prior Treatment) The history is provided by the patient and the spouse. The history is limited by the condition of the patient.   level V caveat applies to the history due to patient being in severe risk for stress. Most history obtained by the spouse. Patient felt fine earlier today came in for lunch and got acutely short of breath. Patient has a history of COPD. Not on home oxygen. Does use albuterol inhaler. Patient also a history of high blood pressure. Patient was not having any chest pain. Patient went to urgent care. Patient referred here. Upon arrival here patient was in acute respiratory distress breathing extremely fast. Brought back to room oxygen saturation was 50%. Patient was drowsy at that time.  Past Medical History  Diagnosis Date  . COPD (chronic obstructive pulmonary disease)   . Hypertension    Past Surgical History  Procedure Laterality Date  . Hernia repair    . Back surgery    . Rotator cuff repair    . Knee arthroplasty     Family History  Problem Relation Age of Onset  . Asthma Mother    Social History  Substance Use Topics  . Smoking status: Current Every Day Smoker -- 0.25 packs/day for 40 years    Types: Cigarettes  . Smokeless tobacco: Never Used  . Alcohol Use: No    Review of Systems  Respiratory: Positive for shortness of breath and wheezing.    level V caveat applies the review of systems other than the shortness of breath and wheezing due to patient being in respiratory distress.    Allergies  Review of patient's allergies indicates no known allergies.  Home Medications   Prior to Admission medications   Medication Sig Start Date End Date Taking? Authorizing Provider   albuterol (PROAIR HFA) 108 (90 BASE) MCG/ACT inhaler Take 1 puff by mouth every 6 (six) hours as needed. 02/07/13   Historical Provider, MD  amLODipine (NORVASC) 5 MG tablet Take 5 mg by mouth daily.    Historical Provider, MD  atenolol (TENORMIN) 25 MG tablet Take 25 mg by mouth daily.    Historical Provider, MD  budesonide-formoterol (SYMBICORT) 160-4.5 MCG/ACT inhaler Take 2 puffs first thing in am and then another 2 puffs about 12 hours later. 08/14/14   [name], MD  furosemide (LASIX) 20 MG tablet Take 1 tablet by mouth daily as needed. 12/23/13   Historical Provider, MD  losartan (COZAAR) 100 MG tablet Take 100 mg by mouth daily.    Historical Provider, MD  naproxen (NAPROSYN) 375 MG tablet Take 375 mg by mouth daily.    Historical Provider, MD  potassium chloride SA (K-DUR,KLOR-CON) 20 MEQ tablet Take 1 tablet by mouth daily as needed. With lasix 12/23/13   Historical Provider, MD  rOPINIRole (REQUIP) 2 MG tablet Take 1 mg by mouth 2 (two) times daily. Takes  tablet daily at dinnertime and  tablet every night at bedtime.    Historical Provider, MD  traMADol (ULTRAM) 50 MG tablet Take 50-100 mg by mouth 3 (three) times daily as needed.    Historical Provider, MD   BP 107/63 mmHg  Pulse 91  Resp 19  SpO2 100% Physical Exam  Constitutional: He appears well-developed and well-nourished.  He appears distressed.  HENT:  Head: Normocephalic and atraumatic.  Eyes: Pupils are equal, round, and reactive to light.  Neck: Normal range of motion.  Cardiovascular: Normal rate, regular rhythm and normal heart sounds.   No murmur heard. Pulmonary/Chest: He is in respiratory distress. He has wheezes.  With retractions.  Abdominal: Soft. Bowel sounds are normal. There is no tenderness.  Musculoskeletal: Normal range of motion. He exhibits no edema.  Neurological:  Drowsy. Moving all 4 extremities.  Skin: Skin is warm.  Nursing note and vitals reviewed.   ED Course  Procedures  (including critical care time) Labs Review Labs Reviewed  COMPREHENSIVE METABOLIC PANEL - Abnormal; Notable for the following:    Glucose, Bld 144 (*)    BUN 29 (*)    All other components within normal limits  CBC WITH DIFFERENTIAL/PLATELET - Abnormal; Notable for the following:    WBC 19.1 (*)    Neutro Abs 13.7 (*)    Monocytes Absolute 2.7 (*)    Eosinophils Absolute 0.8 (*)    All other components within normal limits  I-STAT ARTERIAL BLOOD GAS, ED - Abnormal; Notable for the following:    pH, Arterial 7.185 (*)    pCO2 arterial 71.6 (*)    pO2, Arterial 474.0 (*)    Bicarbonate 27.1 (*)    Acid-base deficit 3.0 (*)    All other components within normal limits  BRAIN NATRIURETIC PEPTIDE  I-STAT ARTERIAL BLOOD GAS, ED    Imaging Review Dg Chest Port 1 View  10/24/2014  CLINICAL DATA:  81 year old male with acute respiratory distress and shortness of breath. EXAM: PORTABLE CHEST 1 VIEW COMPARISON:  10/11/2013 and prior chest radiographs FINDINGS: The cardiomediastinal silhouette is unremarkable. Scarring within the mid and lower lungs again noted. Emphysema is again identified. There is no evidence of focal airspace disease, pulmonary edema, suspicious pulmonary nodule/mass, pleural effusion, or pneumothorax. No acute bony abnormalities are identified. Right shoulder arthroplasty changes noted. IMPRESSION: Emphysema without evidence of acute cardiopulmonary disease. Electronically Signed   By: Harmon PierJeffrey  Hu M.D.   On: 10/24/2014 14:37   I have personally reviewed and evaluated these images and lab results as part of my medical decision-making.   EKG Interpretation   Date/Time:  Friday October 24 2014 15:01:51 EDT Ventricular Rate:  93 PR Interval:  166 QRS Duration: 132 QT Interval:  408 QTC Calculation: 507 R Axis:   38 Text Interpretation:  Sinus rhythm with Premature atrial complexes with  Abberant conduction Non-specific intra-ventricular conduction block Cannot  rule  out Septal infarct , age undetermined Abnormal ECG Confirmed by  Tanza Pellot  MD, Seidy Labreck [phone]) on 10/24/2014 3:03:55 PM      CRITICAL CARE Performed by: Vanetta MuldersZACKOWSKI,Tynslee Bowlds Total critical care time: 30 Critical care time was exclusive of separately billable procedures and treating other patients. Critical care was necessary to treat or prevent imminent or life-threatening deterioration. Critical care was time spent personally by me on the following activities: development of treatment plan with patient and/or surrogate as well as nursing, discussions with consultants, evaluation of patient's response to treatment, examination of patient, obtaining history from patient or surrogate, ordering and performing treatments and interventions, ordering and review of laboratory studies, ordering and review of radiographic studies, pulse oximetry and re-evaluation of patient's condition.    MDM   Final diagnoses:  COPD exacerbation (HCC)  Acute respiratory failure with hypoxia and hypercapnia (HCC)   Patient arrived from urgent care. An acute respiratory failure and distress. Patient with a history  of COPD. Patient felt fine earlier in the day came in for lunch and felt very short of breath. Patient does not normally use oxygen at home. Patient does have albuterol inhaler not currently on prednisone.  Patient in no significant respiratory distress some altered mental status retractions tachypnea take oxygen sats were 55% on room air.   Patient bagged on 100% oxygen was thinking in terms of intubation but was in a difficult room for intubation so we put him on BIPAP. Discussed with patient's wife patient is a full code. Patient has not been intubated before. But has been admitted for exacerbation of COPD. Patient without any other acute illnesses ongoing.  On the BiPAP while preparing for intubation patient started to improve significantly started to talk oxygen saturations came up very rapidly up to 99  her 100%. Patient's color improved. Patient stated he was feeling much better. Prior to this patient was having wheezing bilaterally. He was given nebulizer treatment through the BiPAP. Wheezing improved significantly now just a faint and rare wheezing.  Chest x-ray was negative for any acute pulmonary problems other than the findings of emphysema. Clinically patient was showing marketed improvement much better than expected. However first blood gas that was done shortly after being placed on the BiPAP was concerning. PH was 7.18 PCO2 was 72 PO2 was 476. Patient alert conversing appropriately with his wife patient not wanting to go on ventilator if it all possible. We'll hold off and repeat arterial blood gas. Patient will definitely require admission is a question of whether it is to ICU on ventilator or to step down on BiPAP.  EKG without any acute findings. Patient without any complaint of chest pain.     Vanetta Mulders, MD 10/24/14 1527  Vanetta Mulders, MD 10/24/14 1541  Patient still mentating well. Color still good. Respiratory rate heart rate does significantly improved. Satting 100% on the BiPAP. Patient with some wheezing starting to reoccur no retractions no severe respiratory distress. We'll give a continuous nebulizer and what sets completed we'll repeat failure blood gas. Results of blood gas will determine whether of patient will require an ablation or will be able to be admitted on BiPAP to step down. Clinically patient is improved significantly vertical judgment is that patient has an excellent chance of getting through this without being intubated. Patient is still stating that he prefers not to be intubated.  Vanetta Mulders, MD 10/24/14 [phone]

## 2014-10-24 NOTE — Progress Notes (Addendum)
diagnosis: acute respiratory failure secondary to COPD exacerbation  Patient with known history of COPD, presents to urgent care with shortness of breath, [facility], initial ABG showing pH of 7.18, PCO2 of 71 , initially plan was for intubation , but he had  significant improvement on BiPAP , repeat ABG with PCO2 of 59, and pH of 7.249, in mild respiratory distress, seated IV steroids, magnesium, nebs,   ED physician to discussed with critical care for possible need for ICU admission,  they felt comfortable patient to be admitted to step down unit giving he is improving on BiPAP, patient is accepted to stepdown unit. Huey Bienenstockawood Elgergawy MD

## 2014-10-24 NOTE — ED Provider Notes (Addendum)
Care taken over from Dr. Jodelle GrossZakowski. Patient initially had presented in respiratory distress with significant hypoxia and altered mental status. He was placed on BiPAP as they were preparing for intubation. However he seemed to have rapid improvement on the BiPAP. At this point he is talking in full sentences. He states that his breathing is much better. He does seem to have some slight increased work of breathing and still has wheezing and some mildly diminished breath sounds. He was given a continuous nebulizer treatment as well as magnesium. His oxygen saturations are 100%. A repeat ABG was obtained which shows improvement but has not normalized. His pH is 7.24, PCO2 of 60 and PO2 of 217. Given his marked improvement, I still feel that he does not warrant intubation. He is talking in full sentences. He does not have increased drowsiness. I will consult hospitalist for admission.  17:20 I spoke with Dr. Waymon AmatoHongalgi who will accept pt but requests that CCM be consulted.  I spoke with Dr. Isaiah SergeMannam with CCM who feels that pt is ok for step-down.  [facility] to notify Dr. Waymon AmatoHongalgi.  18:21 pt doing well, still talking in full sentences.  Says that he is feeling better.  Still wheezing, but breath sounds improved  CRITICAL CARE Performed by: Cynthea Zachman Total critical care time: 30 Critical care time was exclusive of separately billable procedures and treating other patients. Critical care was necessary to treat or prevent imminent or life-threatening deterioration. Critical care was time spent personally by me on the following activities: development of treatment plan with patient and/or surrogate as well as nursing, discussions with consultants, evaluation of patient's response to treatment, examination of patient, obtaining history from patient or surrogate, ordering and performing treatments and interventions, ordering and review of laboratory studies, ordering and review of radiographic studies, pulse oximetry  and re-evaluation of patient's condition.   Rolan BuccoMelanie Samwise Eckardt, MD 10/24/14 1743  Rolan BuccoMelanie Piera Downs, MD 10/24/14 Rickey [id number]

## 2014-10-24 NOTE — ED Notes (Signed)
MD at bedside. 

## 2014-10-24 NOTE — ED Notes (Signed)
Wife states patient seen at Urgent Care and referred here for respiratory distress. Pt unable to speak, cyanosis present, drooling, able to pivot into chair from car, pt immediately into ED room and EDP to bedside. Pt breathing/ventilation assisted with BVM until placed on Bipap.

## 2014-10-25 DIAGNOSIS — J9602 Acute respiratory failure with hypercapnia: Secondary | ICD-10-CM

## 2014-10-25 DIAGNOSIS — J9601 Acute respiratory failure with hypoxia: Secondary | ICD-10-CM

## 2014-10-25 LAB — TROPONIN I

## 2014-10-25 LAB — BASIC METABOLIC PANEL
Anion gap: 8 (ref 5–15)
BUN: 42 mg/dL — ABNORMAL HIGH (ref 6–20)
CALCIUM: 8.9 mg/dL (ref 8.9–10.3)
CO2: 25 mmol/L (ref 22–32)
CREATININE: 1.89 mg/dL — AB (ref 0.61–1.24)
Chloride: 111 mmol/L (ref 101–111)
GFR calc non Af Amer: 32 mL/min — ABNORMAL LOW (ref >60)
GFR, EST AFRICAN AMERICAN: 37 mL/min — AB (ref >60)
GLUCOSE: 135 mg/dL — AB (ref 65–99)
Potassium: 4.9 mmol/L (ref 3.5–5.1)
Sodium: 144 mmol/L (ref 135–145)

## 2014-10-25 LAB — CBC
HCT: 42 % (ref 39.0–52.0)
Hemoglobin: 13.6 g/dL (ref 13.0–17.0)
MCH: 32.1 pg (ref 26.0–34.0)
MCHC: 32.4 g/dL (ref 30.0–36.0)
MCV: 99.1 fL (ref 78.0–100.0)
PLATELETS: 149 10*3/uL — AB (ref 150–400)
RBC: 4.24 MIL/uL (ref 4.22–5.81)
RDW: 14.7 % (ref 11.5–15.5)
WBC: 7.8 10*3/uL (ref 4.0–10.5)

## 2014-10-25 LAB — HEPARIN LEVEL (UNFRACTIONATED): Heparin Unfractionated: 0.25 IU/mL — ABNORMAL LOW (ref 0.30–0.70)

## 2014-10-25 MED ORDER — NITROGLYCERIN 2 % TD OINT
0.5000 [in_us] | TOPICAL_OINTMENT | Freq: Four times a day (QID) | TRANSDERMAL | Status: DC
  Administered 2014-10-25 – 2014-10-26 (×4): 0.5 [in_us] via TOPICAL
  Filled 2014-10-25: qty 30

## 2014-10-25 MED ORDER — BISOPROLOL FUMARATE 5 MG PO TABS
5.0000 mg | ORAL_TABLET | Freq: Every day | ORAL | Status: DC
  Administered 2014-10-25 – 2014-10-26 (×2): 5 mg via ORAL
  Filled 2014-10-25 (×2): qty 1

## 2014-10-25 MED ORDER — METHYLPREDNISOLONE SODIUM SUCC 125 MG IJ SOLR
125.0000 mg | Freq: Four times a day (QID) | INTRAMUSCULAR | Status: DC
Start: 2014-10-25 — End: 2014-10-25
  Administered 2014-10-25: 125 mg via INTRAVENOUS
  Filled 2014-10-25: qty 2

## 2014-10-25 MED ORDER — ASPIRIN 81 MG PO CHEW
324.0000 mg | CHEWABLE_TABLET | Freq: Once | ORAL | Status: AC
  Administered 2014-10-25: 324 mg via ORAL
  Filled 2014-10-25: qty 4

## 2014-10-25 MED ORDER — METHYLPREDNISOLONE SODIUM SUCC 125 MG IJ SOLR
60.0000 mg | Freq: Two times a day (BID) | INTRAMUSCULAR | Status: DC
  Administered 2014-10-25 – 2014-10-26 (×2): 60 mg via INTRAVENOUS
  Filled 2014-10-25 (×2): qty 2

## 2014-10-25 MED ORDER — LEVOFLOXACIN IN D5W 750 MG/150ML IV SOLN
750.0000 mg | INTRAVENOUS | Status: DC
  Administered 2014-10-25: 750 mg via INTRAVENOUS
  Filled 2014-10-25: qty 150

## 2014-10-25 MED ORDER — HEPARIN BOLUS VIA INFUSION
4000.0000 [IU] | Freq: Once | INTRAVENOUS | Status: AC
  Administered 2014-10-25: 4000 [IU] via INTRAVENOUS
  Filled 2014-10-25: qty 4000

## 2014-10-25 MED ORDER — IPRATROPIUM-ALBUTEROL 0.5-2.5 (3) MG/3ML IN SOLN
3.0000 mL | Freq: Four times a day (QID) | RESPIRATORY_TRACT | Status: DC
  Administered 2014-10-25 – 2014-10-26 (×4): 3 mL via RESPIRATORY_TRACT
  Filled 2014-10-25 (×4): qty 3

## 2014-10-25 MED ORDER — ASPIRIN EC 81 MG PO TBEC
81.0000 mg | DELAYED_RELEASE_TABLET | Freq: Every day | ORAL | Status: DC
  Administered 2014-10-26: 81 mg via ORAL
  Filled 2014-10-25: qty 1

## 2014-10-25 MED ORDER — HEPARIN (PORCINE) IN NACL 100-0.45 UNIT/ML-% IJ SOLN
1000.0000 [IU]/h | INTRAMUSCULAR | Status: DC
  Administered 2014-10-25: 750 [IU]/h via INTRAVENOUS
  Filled 2014-10-25: qty 250

## 2014-10-25 NOTE — Progress Notes (Signed)
ANTICOAGULATION CONSULT NOTE - Initial Consult  Pharmacy Consult for Heparin  Indication: chest pain/ACS  No Known Allergies  Patient Measurements: Height: 5\' 7"  (170.2 cm) Weight: 141 lb 12.1 oz (64.3 kg) IBW/kg (Calculated) : 66.1   Vital Signs: Temp: 98.2 F (36.8 C) (10/15 1211) Temp Source: Oral (10/15 1211) BP: 128/73 mmHg (10/15 1211) Pulse Rate: 106 (10/15 1211)  Labs:  Recent Labs  10/24/14 1404 10/24/14 2047 10/25/14 0248  HGB 15.9 14.2 13.6  HCT 48.3 44.8 42.0  PLT 191 145* 149*  CREATININE 1.04 1.68* 1.89*  TROPONINI <0.03  --   --     Estimated Creatinine Clearance: 28.8 mL/min (by C-G formula based on Cr of 1.89).   Medical History: Past Medical History  Diagnosis Date  . COPD (chronic obstructive pulmonary disease) (HCC)   . Hypertension       Assessment: 81yom admitted with acute SOB.  Improved today with treatment of COPD.  Plan check cardiac enzymes and treat with heparin drip until r/o ACS. No bleeding, CBC stable,   Goal of Therapy:  Heparin level 0.3-0.7 units/ml Monitor platelets by anticoagulation protocol: Yes   Plan:  Heparin bolus 4000 uts x1 Heparin drip 750 uts/hr HL in 6hr from start  Daily CBC, HL  Leota SauersLisa Zhania Shaheen Pharm.D. CPP, BCPS Clinical Pharmacist [phone] 10/25/2014 12:49 PM

## 2014-10-25 NOTE — H&P (Signed)
History and Physical  Joe Gonzalez  ZOX:[id number]  DOB: 1933-02-10  DOA: 10/24/2014  Referring physician: Vanetta Mulders, MD PCP: [name], MD   Chief Complaint: Dyspnea  HPI: Joe Gonzalez is a 81 y.o. male with a past medical history significant for COPD and HTN and RLS who presents with dyspnea.  The patient was in his usual state of health until the last few weeks when he has had increasing dyspnea, waxing and waning. This morning the patient went out early to mow in the fields, and when he returned he was acutely dyspneic and had chest tightness not related to his home inhalers.  In the ED, the patient had acidosis, hypercarbia, and hypoxia to the 50s. He was put on BiPAP, given magnesium, steroids, bronchodilators and prepared for transfer to stepdown unit at [facility].  The patient's pathology was recently recommended he stop atenolol because of his COPD and recommended nebivolol. The patient's PCP started him on metoprolol about one month ago instead.        Review of Systems:  Patient seen 6:04 AM on 10/25/2014. Pt complains of dyspnea, cough, wheezing. Pt denies any fever, chills, sputum, malaise.  All other systems negative except as just noted or noted in the history of present illness.  Past Medical History  Diagnosis Date  . COPD (chronic obstructive pulmonary disease) (HCC)   . Hypertension   The above past medical history was reviewed.  Past Surgical History  Procedure Laterality Date  . Hernia repair    . Back surgery    . Rotator cuff repair    . Knee arthroplasty    The above surgical history was reviewed.  Social History: Patient lives with his wfie.  He is an active smoker.  He is a lifelong tobacco farmer.    No Known Allergies  Family History  Problem Relation Age of Onset  . Asthma Mother     Prior to Admission medications   Medication Sig Start Date End Date Taking? Authorizing Provider  albuterol (PROAIR HFA) 108 (90 BASE)  MCG/ACT inhaler Take 1 puff by mouth every 6 (six) hours as needed for wheezing.  02/07/13  Yes Historical Provider, MD  amLODipine (NORVASC) 5 MG tablet Take 5 mg by mouth daily.   Yes Historical Provider, MD  budesonide-formoterol (SYMBICORT) 160-4.5 MCG/ACT inhaler Take 2 puffs first thing in am and then another 2 puffs about 12 hours later. Patient taking differently: Inhale 2 puffs into the lungs every 12 (twelve) hours. Take 2 puffs first thing in am and then another 2 puffs about 12 hours later. 08/14/14  Yes [name], MD  furosemide (LASIX) 20 MG tablet Take 1 tablet by mouth daily as needed. 12/23/13  Yes Historical Provider, MD  ipratropium-albuterol (DUONEB) 0.5-2.5 (3) MG/3ML SOLN Take 3 mLs by nebulization every 6 (six) hours as needed (wheezing).   Yes Historical Provider, MD  naproxen (NAPROSYN) 375 MG tablet Take 375 mg by mouth daily.   Yes Historical Provider, MD  potassium chloride SA (K-DUR,KLOR-CON) 20 MEQ tablet Take 1 tablet by mouth daily as needed. With lasix 12/23/13  Yes Historical Provider, MD  rOPINIRole (REQUIP) 2 MG tablet Take 1 mg by mouth 2 (two) times daily. Takes  tablet daily at dinnertime and  tablet every night at bedtime.   Yes Historical Provider, MD  traMADol (ULTRAM) 50 MG tablet Take 50-100 mg by mouth 3 (three) times daily as needed for moderate pain.    Yes Historical Provider, MD  Physical Exam: BP 117/54 mmHg  Pulse 96  Temp(Src) 97.4 F (36.3 C) (Axillary)  Resp 16  Ht 5\' 7"  (1.702 m)  Wt 64.3 kg (141 lb 12.1 oz)  BMI 22.20 kg/m2  SpO2 97% General appearance: Well-developed, adult male, alert and in mild distress from dyspnea on Bipap.  Responds appropriately to questions.   Eyes: Sclerae normal without icterus, conjunctiva pink, lids and lashes normal.  PERRL and EOMI.   Nose: No deformity, discharge, or epistaxis.   Mouth: OP moist without erythema, exudates, cobblestoning, or ulcers.  No airway deformities.   Skin: Warm and dry.  No  jaundice.  No suspicious rashes or lesions. Cardiac: Tachycardic regular, nl S1-S2, no murmurs appreciated.   Respiratory: On Bipap.  Tight with inspiration, bilateral wheezes and no crackles on my exam. Abdomen: BS present.  Abdomen soft without rigidity.  No TTP or rebound all quadrants. No ascites, distension.   Neuro: Sensorium intact.  Cranial nerves 3-12 intact.  Speech is fluent.  Attention and concentration are normal.  Memory seems intact.  Moves all extremities equally and with normal coordination.    Psych: Appropriate affect.  Speech normal. Thought content/process linear/appropriate.  No evidence of aural or visual hallucinations or delusions.       Labs on Admission:  The metabolic panel is unremarkable. The transaminases and bilirubin are normal The complete blood count is notable for leukocytosis. The BNP is normal. The troponin is normal. The initial ABG shows pH 7.18 and PCO2 71, which decreased to 59 after an hour of BiPAP.   Radiological Exams on Admission: Personally reviewed: Dg Chest Port 1 View  10/24/2014  CLINICAL DATA:  81 year old male with acute respiratory distress and shortness of breath. EXAM: PORTABLE CHEST 1 VIEW COMPARISON:  10/11/2013 and prior chest radiographs FINDINGS: The cardiomediastinal silhouette is unremarkable. Scarring within the mid and lower lungs again noted. Emphysema is again identified. There is no evidence of focal airspace disease, pulmonary edema, suspicious pulmonary nodule/mass, pleural effusion, or pneumothorax. No acute bony abnormalities are identified. Right shoulder arthroplasty changes noted. IMPRESSION: Emphysema without evidence of acute cardiopulmonary disease. Electronically Signed   By: Harmon PierJeffrey  Hu M.D.   On: 10/24/2014 14:37    EKG: Independently reviewed. Sinus tachycardia with elevated QTC and a nonspecific IVCD.    Assessment/Plan  1. Acute on chronic COPD with hypoxia and hypercarbia:  -BiPAP -Repeat ABG  overnight -DuoNeb once -Nebulized albuterol scheduled every 6 hours and every 2 hours when necessary -Methylprednisolone 125 mg every 6 hours -Procalcitonin and start levofloxacin if elevated  2. HTN:  Stable. - Continue home amlodipine - Hold metoprolol  3. Smoking:  The patient is a tobacco farmer and does not desire to quit.    4. Restless leg syndrome:  Stable. - Continue Requip and tramadol    DVT PPx: Lovenox Diet: Regular Code Status: Full Family Communication: The patient's diagnosis, treatment, and expected course were discussed with his wife and family at the bedside. CODE STATUS was clarified. All questions were answered.   Disposition Plan:  At the time of admission, it appears that the appropriate admission status for this patient is INPATIENT. This is judged to be reasonable and necessary in order to provide the required intensity of service to ensure the patient's safety given the presenting symptoms, physical exam findings, and initial radiographic and laboratory data in the context of their chronic comorbidities.  Together, these circumstances are felt to place her/him at high risk for further clinical deterioration threatening life,  limb, or organ.       Alberteen Sam [facility] Pager [phone]

## 2014-10-25 NOTE — Progress Notes (Signed)
ANTICOAGULATION CONSULT NOTE - Follow Up Consult  Pharmacy Consult for Heparin Indication: chest pain/ACS  No Known Allergies  Patient Measurements: Height: 5\' 7"  (170.2 cm) Weight: 141 lb 12.1 oz (64.3 kg) IBW/kg (Calculated) : 66.1 Heparin Dosing Weight: 64.3 kg  Vital Signs: Temp: 97.6 F (36.4 C) (10/15 1534) Temp Source: Oral (10/15 1534) BP: 122/63 mmHg (10/15 1800) Pulse Rate: 92 (10/15 1800)  Labs:  Recent Labs  10/24/14 1404 10/24/14 2047 10/25/14 0248 10/25/14 1220 10/25/14 1840  HGB 15.9 14.2 13.6  --   --   HCT 48.3 44.8 42.0  --   --   PLT 191 145* 149*  --   --   HEPARINUNFRC  --   --   --   --  0.25*  CREATININE 1.04 1.68* 1.89*  --   --   TROPONINI <0.03  --   --  <0.03  --     Estimated Creatinine Clearance: 28.8 mL/min (by C-G formula based on Cr of 1.89).   Medications:  Infusions:  . sodium chloride 75 mL/hr at 10/25/14 1204  . heparin 750 Units/hr (10/25/14 1427)    Assessment: 81 year old male on IV heparin for ACS.   Initial heparin level is low at 0.25 on 750 units/hr.  H/H is within normal limits. Platelets are 149- will monitor.  Per RN no issues reported about line or bleeding.   Goal of Therapy:  Heparin level 0.3-0.7 units/ml Monitor platelets by anticoagulation protocol: Yes   Plan:  Increase heparin to 900 units/hr. Recheck level in 8 hours.  Daily heparin level and CBC while on therapy.   Link SnufferJessica Cathline Dowen, PharmD, BCPS Clinical Pharmacist [phone] 10/25/2014,7:32 PM

## 2014-10-25 NOTE — Progress Notes (Signed)
[facility] TEAM 1 - Stepdown/ICU TEAM PROGRESS NOTE  Diona FoleyBobby L Grant ZOX:[id number] DOB: 03-27-1933 DOA: 10/24/2014 PCP: Carollee MassedALEXANDER,ANTHONY, MD  Admit HPI / Brief Narrative: 81 y.o. male with a history of COPD, HTN, and RLS who presented with dyspnea.  The patient reported a few weeks of increasing dyspnea, waxing and waning. The morning of his admission the patient went out to mow in the fields, and when he returned he was acutely dyspneic and had chest tightness not relieved by his home inhalers.  In the ED, the patient had acidosis, hypercarbia, and hypoxia to the 50s. He was put on BiPAP, given magnesium, steroids, and bronchodilators.  HPI/Subjective: The patient states he is feeling much better today.  He feels much less short of breath.  He feels that his exacerbation is likely related to recent initiation of a beta blocker.  He works in a dusty environment and is also exposed to diesel fumes, gasoline fumes, and various other pesticide fumes on a regular basis.  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    Assessment/Plan:  Acute bronchospastic COPD exacerbation w/ hypercarbic and hypoxic respiratory failure  clinically the patient appears to be stabilizing but there is ongoing diffuse wheezing on exam - the patient is highly motivated to go home but I have asked him to spend at least 1 more night in hopes of further stabilizing his respiratory status   Ongoing tobacco abuse  I have counseled the patient on the need to discontinue ongoing tobacco abuse  Acute renal failure  hydrate today and follow-up in a.m.  HTN blood pressures currently well-controlled  RLS quiescent  Code Status: FULL Family Communication: spoke w/ wife at bedside  Disposition Plan: SDU as pt may require return to BIPAP  Consultants: none  Procedures: none  Antibiotics: Levaquin 10/15 >  DVT prophylaxis: lovenox  Objective: Blood  pressure 134/56, pulse 99, temperature 98 F (36.7 C), temperature source Oral, resp. rate 16, height 5\' 7"  (1.702 m), weight 64.3 kg (141 lb 12.1 oz), SpO2 97 %.  Intake/Output Summary (Last 24 hours) at 10/25/14 1019 Last data filed at 10/25/14 0800  Gross per 24 hour  Intake  235.5 ml  Output      0 ml  Net  235.5 ml   Exam: General: No acute respiratory distress on [place] O2 Lungs: diffuse exp wheezing - no focal crackles - fair air movement th/o all fields  Cardiovascular: Regular rate and rhythm without murmur gallop or rub normal S1 and S2 Abdomen: Nontender, nondistended, soft, bowel sounds positive, no rebound, no ascites, no appreciable mass Extremities: No significant cyanosis, clubbing, or edema bilateral lower extremities  Data Reviewed: Basic Metabolic Panel:  Recent Labs Lab 10/24/14 1404 10/24/14 2047 10/25/14 0248  NA 143  --  144  K 4.3  --  4.9  CL 110  --  111  CO2 28  --  25  GLUCOSE 144*  --  135*  BUN 29*  --  42*  CREATININE 1.04 1.68* 1.89*  CALCIUM 9.2  --  8.9    CBC:  Recent Labs Lab 10/24/14 1404 10/24/14 2047 10/25/14 0248  WBC 19.1* 11.1* 7.8  NEUTROABS 13.7*  --   --   HGB 15.9 14.2 13.6  HCT 48.3 44.8 42.0  MCV 97.8 99.3 99.1  PLT 191 145* 149*    Liver Function Tests:  Recent Labs Lab 10/24/14 1404  AST 29  ALT 26  ALKPHOS 66  BILITOT 0.9  PROT 7.2  ALBUMIN 4.3   Cardiac Enzymes:  Recent Labs Lab 10/24/14 1404  TROPONINI <0.03    Recent Results (from the past 240 hour(s))  MRSA PCR Screening     Status: None   Collection Time: 10/24/14  7:40 PM  Result Value Ref Range Status   MRSA by PCR NEGATIVE NEGATIVE Final    Comment:        The GeneXpert MRSA Assay (FDA approved for NASAL specimens only), is one component of a comprehensive MRSA colonization surveillance program. It is not intended to diagnose MRSA infection nor to guide or monitor treatment for MRSA infections.      Studies:   Recent x-ray  studies have been reviewed in detail by the Attending Physician  Scheduled Meds:  Scheduled Meds: . albuterol  2.5 mg Nebulization Q6H  . amLODipine  5 mg Oral Daily  . budesonide-formoterol  2 puff Inhalation Q12H  . enoxaparin (LOVENOX) injection  40 mg Subcutaneous Q24H  . ipratropium  0.5 mg Nebulization Once  . levofloxacin (LEVAQUIN) IV  750 mg Intravenous Q48H  . methylPREDNISolone (SOLU-MEDROL) injection  125 mg Intravenous Q6H  . rOPINIRole  1 mg Oral BID  . sodium chloride  3 mL Intravenous Q12H    Time spent on care of this patient: 35 mins   Lavaris Sexson T , MD   [facility] Office  [phone] Pager - Text Page per Loretha Stapler as per below:  On-Call/Text Page:      [email or web address]      password [email or web address]  If 7PM-7AM, please contact night-coverage [email or web address] Password [email or web address] 10/25/2014, 10:19 AM   LOS: 1 day

## 2014-10-25 NOTE — Progress Notes (Signed)
Initial Nutrition Assessment  DOCUMENTATION CODES:  Not applicable  INTERVENTION:  Pt declined snacks/supps, Monitor oral intake and reassess the need for them.  NUTRITION DIAGNOSIS:  Increased nutrient needs related to the maintenance of LBM in elderly as evidenced by estimated nutritional requirements for this population.  GOAL:  Patient will meet greater than or equal to 90% of their needs  MONITOR:  PO intake, Weight trends, I & O's, Labs  REASON FOR ASSESSMENT:  Consult Assessment of nutrition requirement/status  ASSESSMENT:  81 y.o. male Past PMHx COPD, HTN,  who presents with dyspnea and hypoxia  Pt was slightly upset when Rd arrived. He was hungry, but was not allowed to try to eat until the Dr. Wilmon ArmsArrive to assess as he ate in case he started to desat/choke.   He reports no weight loss and his normal weight is 145 lbs. He does not take any vitamin/mineral supplements nor any oral nutritional supplements. He does not follow any type of diet at home. He had had no n/v/c/d. He grew tired of the questions and states that "there is nothing wrong with me except I was a little SOB"  He declined any supps/snacks at this time.  He appears to be well nourished w/ no recent history of poor intake. MST score was 0  NFPE: WDL.   Diet Order:  Diet regular Room service appropriate?: Yes; Fluid consistency:: Thin  Skin:  Reviewed, no issues  Last BM:  10/13  Height:  Ht Readings from Last 1 Encounters:  10/24/14 5\' 7"  (1.702 m)   Weight:  Wt Readings from Last 1 Encounters:  10/24/14 141 lb 12.1 oz (64.3 kg)   Wt Readings from Last 10 Encounters:  10/24/14 141 lb 12.1 oz (64.3 kg)  08/01/14 144 lb (65.318 kg)  07/03/14 148 lb (67.132 kg)  02/10/14 149 lb 3.2 oz (67.677 kg)  11/11/13 149 lb 9.6 oz (67.858 kg)  10/11/13 146 lb (66.225 kg)  02/19/13 141 lb (63.957 kg)  01/17/13 141 lb 12.8 oz (64.32 kg)  01/08/13 145 lb (65.772 kg)   Ideal Body Weight:  67.3 kg  BMI:   Body mass index is 22.2 kg/(m^2).  Estimated Nutritional Needs:  Kcal:  1650-1850 (26-28 kcal/kg) Protein:  70-84 (1.1-1.3 g/kg bw) Fluid:  1.7-1.9 liters fluid  EDUCATION NEEDS:  No education needs identified at this time  Joe Gonzalez RD, LDN Nutrition Pager: [phone] 10/25/2014 10:55 AM

## 2014-10-26 LAB — BASIC METABOLIC PANEL
ANION GAP: 7 (ref 5–15)
BUN: 46 mg/dL — AB (ref 6–20)
CHLORIDE: 104 mmol/L (ref 101–111)
CO2: 26 mmol/L (ref 22–32)
Calcium: 8.5 mg/dL — ABNORMAL LOW (ref 8.9–10.3)
Creatinine, Ser: 1.6 mg/dL — ABNORMAL HIGH (ref 0.61–1.24)
GFR, EST AFRICAN AMERICAN: 46 mL/min — AB (ref >60)
GFR, EST NON AFRICAN AMERICAN: 39 mL/min — AB (ref >60)
Glucose, Bld: 189 mg/dL — ABNORMAL HIGH (ref 65–99)
POTASSIUM: 4.8 mmol/L (ref 3.5–5.1)
SODIUM: 137 mmol/L (ref 135–145)

## 2014-10-26 LAB — CBC
HEMATOCRIT: 38.9 % — AB (ref 39.0–52.0)
Hemoglobin: 12.5 g/dL — ABNORMAL LOW (ref 13.0–17.0)
MCH: 31.3 pg (ref 26.0–34.0)
MCHC: 32.1 g/dL (ref 30.0–36.0)
MCV: 97.5 fL (ref 78.0–100.0)
Platelets: 152 10*3/uL (ref 150–400)
RBC: 3.99 MIL/uL — AB (ref 4.22–5.81)
RDW: 14.5 % (ref 11.5–15.5)
WBC: 15.6 10*3/uL — AB (ref 4.0–10.5)

## 2014-10-26 LAB — HEPARIN LEVEL (UNFRACTIONATED): Heparin Unfractionated: 0.14 IU/mL — ABNORMAL LOW (ref 0.30–0.70)

## 2014-10-26 LAB — GLUCOSE, CAPILLARY: Glucose-Capillary: 134 mg/dL — ABNORMAL HIGH (ref 65–99)

## 2014-10-26 LAB — TROPONIN I: TROPONIN I: 0.04 ng/mL — AB (ref <0.031)

## 2014-10-26 MED ORDER — HEPARIN BOLUS VIA INFUSION
2000.0000 [IU] | Freq: Once | INTRAVENOUS | Status: AC
  Administered 2014-10-26: 2000 [IU] via INTRAVENOUS
  Filled 2014-10-26: qty 2000

## 2014-10-26 MED ORDER — PREDNISONE 20 MG PO TABS
40.0000 mg | ORAL_TABLET | Freq: Every day | ORAL | Status: DC
Start: 2014-10-27 — End: 2014-10-26

## 2014-10-26 MED ORDER — ENOXAPARIN SODIUM 30 MG/0.3ML ~~LOC~~ SOLN
30.0000 mg | SUBCUTANEOUS | Status: DC
Start: 2014-10-26 — End: 2014-10-26

## 2014-10-26 MED ORDER — ASPIRIN 81 MG PO TBEC: 81.0000 mg | DELAYED_RELEASE_TABLET | Freq: Every day | ORAL | Status: AC

## 2014-10-26 MED ORDER — LEVOFLOXACIN 750 MG PO TABS
750.0000 mg | ORAL_TABLET | ORAL | Status: DC
  Filled 2014-10-26: qty 1

## 2014-10-26 MED ORDER — PREDNISONE 20 MG PO TABS: 40.0000 mg | ORAL_TABLET | Freq: Every day | ORAL | Status: DC

## 2014-10-26 MED ORDER — LEVOFLOXACIN 750 MG PO TABS: 750.0000 mg | ORAL_TABLET | ORAL | Status: DC

## 2014-10-26 NOTE — Discharge Instructions (Signed)
Chronic Obstructive Pulmonary Disease Chronic obstructive pulmonary disease (COPD) is a common lung condition in which airflow from the lungs is limited. COPD is a general term that can be used to describe many different lung problems that limit airflow, including both chronic bronchitis and emphysema. If you have COPD, your lung function will probably never return to normal, but there are measures you can take to improve lung function and make yourself feel better. CAUSES   Smoking (common).  Exposure to secondhand smoke.  Genetic problems.  Chronic inflammatory lung diseases or recurrent infections. SYMPTOMS  Shortness of breath, especially with physical activity.  Deep, persistent (chronic) cough with a large amount of thick mucus.  Wheezing.  Rapid breaths (tachypnea).  Gray or bluish discoloration (cyanosis) of the skin, especially in your fingers, toes, or lips.  Fatigue.  Weight loss.  Frequent infections or episodes when breathing symptoms become much worse (exacerbations).  Chest tightness. DIAGNOSIS Your health care provider will take a medical history and perform a physical examination to diagnose COPD. Additional tests for COPD may include:  Lung (pulmonary) function tests.  Chest X-ray.  CT scan.  Blood tests. TREATMENT  Treatment for COPD may include:  Inhaler and nebulizer medicines. These help manage the symptoms of COPD and make your breathing more comfortable.  Supplemental oxygen. Supplemental oxygen is only helpful if you have a low oxygen level in your blood.  Exercise and physical activity. These are beneficial for nearly all people with COPD.  Lung surgery or transplant.  Nutrition therapy to gain weight, if you are underweight.  Pulmonary rehabilitation. This may involve working with a team of health care providers and specialists, such as respiratory, occupational, and physical therapists. HOME CARE INSTRUCTIONS  Take all medicines  (inhaled or pills) as directed by your health care provider.  Avoid over-the-counter medicines or cough syrups that dry up your airway (such as antihistamines) and slow down the elimination of secretions unless instructed otherwise by your health care provider.  If you are a smoker, the most important thing that you can do is stop smoking. Continuing to smoke will cause further lung damage and breathing trouble. Ask your health care provider for help with quitting smoking. He or she can direct you to community resources or hospitals that provide support.  Avoid exposure to irritants such as smoke, chemicals, and fumes that aggravate your breathing.  Use oxygen therapy and pulmonary rehabilitation if directed by your health care provider. If you require home oxygen therapy, ask your health care provider whether you should purchase a pulse oximeter to measure your oxygen level at home.  Avoid contact with individuals who have a contagious illness.  Avoid extreme temperature and humidity changes.  Eat healthy foods. Eating smaller, more frequent meals and resting before meals may help you maintain your strength.  Stay active, but balance activity with periods of rest. Exercise and physical activity will help you maintain your ability to do things you want to do.  Preventing infection and hospitalization is very important when you have COPD. Make sure to receive all the vaccines your health care provider recommends, especially the pneumococcal and influenza vaccines. Ask your health care provider whether you need a pneumonia vaccine.  Learn and use relaxation techniques to manage stress.  Learn and use controlled breathing techniques as directed by your health care provider. Controlled breathing techniques include:  Pursed lip breathing. Start by breathing in (inhaling) through your nose for 1 second. Then, purse your lips as if you were   going to whistle and breathe out (exhale) through the  pursed lips for 2 seconds.  Diaphragmatic breathing. Start by putting one hand on your abdomen just above your waist. Inhale slowly through your nose. The hand on your abdomen should move out. Then purse your lips and exhale slowly. You should be able to feel the hand on your abdomen moving in as you exhale.  Learn and use controlled coughing to clear mucus from your lungs. Controlled coughing is a series of short, progressive coughs. The steps of controlled coughing are: 1. Lean your head slightly forward. 2. Breathe in deeply using diaphragmatic breathing. 3. Try to hold your breath for 3 seconds. 4. Keep your mouth slightly open while coughing twice. 5. Spit any mucus out into a tissue. 6. Rest and repeat the steps once or twice as needed. SEEK MEDICAL CARE IF:  You are coughing up more mucus than usual.  There is a change in the color or thickness of your mucus.  Your breathing is more labored than usual.  Your breathing is faster than usual. SEEK IMMEDIATE MEDICAL CARE IF:  You have shortness of breath while you are resting.  You have shortness of breath that prevents you from:  Being able to talk.  Performing your usual physical activities.  You have chest pain lasting longer than 5 minutes.  Your skin color is more cyanotic than usual.  You measure low oxygen saturations for longer than 5 minutes with a pulse oximeter. MAKE SURE YOU:  Understand these instructions.  Will watch your condition.  Will get help right away if you are not doing well or get worse.   This information is not intended to replace advice given to you by your health care provider. Make sure you discuss any questions you have with your health care provider.   Document Released: 10/06/2004 Document Revised: 01/17/2014 Document Reviewed: 08/23/2012 Elsevier Interactive Patient Education 2016 [employer]  

## 2014-10-26 NOTE — Progress Notes (Signed)
Utilization Review Completed.Eri Platten T10/16/2016  

## 2014-10-26 NOTE — Progress Notes (Signed)
Patient ambulated around entire unit twice without O2. Sats dropped to 88% on room air. Patient has fast recovery once seated with sats up to 93% on room air. Patient noted he felt short of breath and needed O2 on once back to room. Will continue to monitor closely.

## 2014-10-26 NOTE — Progress Notes (Signed)
ANTICOAGULATION CONSULT NOTE  Pharmacy Consult for Heparin Indication: chest pain/ACS  No Known Allergies  Patient Measurements: Height: 5\' 7"  (170.2 cm) Weight: 141 lb 12.1 oz (64.3 kg) IBW/kg (Calculated) : 66.1 Heparin Dosing Weight: 64.3 kg  Vital Signs: Temp: 98 F (36.7 C) (10/16 0400) Temp Source: Oral (10/16 0400) BP: 139/52 mmHg (10/16 0300) Pulse Rate: 92 (10/16 0300)  Labs:  Recent Labs  10/24/14 1404 10/24/14 2047 10/25/14 0248 10/25/14 1220 10/25/14 1840 10/26/14 0416  HGB 15.9 14.2 13.6  --   --  12.5*  HCT 48.3 44.8 42.0  --   --  38.9*  PLT 191 145* 149*  --   --  152  HEPARINUNFRC  --   --   --   --  0.25* 0.14*  CREATININE 1.04 1.68* 1.89*  --   --   --   TROPONINI <0.03  --   --  <0.03 <0.03  --     Estimated Creatinine Clearance: 28.8 mL/min (by C-G formula based on Cr of 1.89).  Assessment: 81 year old male with chest pain/SOB for heparin.  Cardiac enzymes negative  Goal of Therapy:  Heparin level 0.3-0.7 units/ml Monitor platelets by anticoagulation protocol: Yes   Plan:  Heparin 2000 units IV bolus, then increase heparin 1000 units/hr Check heparin level in 8 hours.   Geannie RisenGreg Priya Matsen, PharmD, BCPS  10/26/2014,5:10 AM

## 2014-10-26 NOTE — Discharge Summary (Signed)
DISCHARGE SUMMARY  Joe Gonzalez  MR#: [id number]  DOB:[birth year]  Date of Admission: 10/24/2014 Date of Discharge: 10/26/2014  Attending Physician:Venus Gilles T  Patient's PPI:RJJOACZYS,AYTKZSW, MD  Consults:  none  Disposition: D/C home   Follow-up Appts:     Follow-up Information    Follow up with [name], MD In 3 days.   Specialty:  Internal Medicine   Why:  You will need a recheck of your kidney function (blood work) in 3-5 days   Contact information:   [address] [place] [address] [phone]      Tests Needing Follow-up: -recheck of renal function is suggested in 5-7 days   Discharge Diagnoses: Acute bronchospastic COPD exacerbation w/ hypercarbic and hypoxic respiratory failure  Ongoing tobacco abuse  Acute renal failure  HTN RLS  Initial presentation: 81 y.o. male with a history of COPD, HTN, and RLS who presented with dyspnea. The patient reported a few weeks of increasing dyspnea, waxing and waning. The morning of his admission the patient went out to mow in the fields, and when he returned he was acutely dyspneic and had chest tightness not relieved by his home inhalers.  In the ED, the patient had acidosis, hypercarbia, and hypoxia to the 50s. He was put on BiPAP, given magnesium, steroids, and bronchodilators.  Hospital Course:  Acute bronchospastic COPD exacerbation w/ hypercarbic and hypoxic respiratory failure  The pt improved w/ the usual intensive tx - at the time of his d/c he denies any sob, and is able to ambulate around the unit 3 times on RA only w/ sats dipping to 88% but not lower - he denies chest pain or pressure, and his highly motivated to go home - counseled pt to avoid dusty work or volatile chemicals until he feels he is fully recuperated  L arm tingling On 10/15 pt had an episode of L arm tingling - his EKG noted only his chronic LBBB and was therefore not helpful - troponins were cycled and did not  become significantly elevated - Epic review notes nuc med stress test and TTE within last 6 months, both of which were unremarkable - no further sx as of 10/16 - no further inpt w/u felt to be indicated at present   Ongoing tobacco abuse  I have counseled the patient on the need to discontinue ongoing tobacco abuse  Acute renal failure  crt has improved w/ hydration, but has not yet normalized - I offered another 24hrs or intpt monitoring, but the pt is highly motivated to d/c home - I do not feel this is unreasonable, but have instructed the pt that he will need to have his renal fxn rechecked by his PCP this week   HTN blood pressures currently well-controlled  RLS quiescent    Medication List    STOP taking these medications        naproxen 375 MG tablet  Commonly known as:  NAPROSYN      TAKE these medications        amLODipine 5 MG tablet  Commonly known as:  NORVASC  Take 5 mg by mouth daily.     aspirin 81 MG EC tablet  Take 1 tablet (81 mg total) by mouth daily.     budesonide-formoterol 160-4.5 MCG/ACT inhaler  Commonly known as:  SYMBICORT  Take 2 puffs first thing in am and then another 2 puffs about 12 hours later.     furosemide 20 MG tablet  Commonly known as:  LASIX  Take 1  tablet by mouth daily as needed.     ipratropium-albuterol 0.5-2.5 (3) MG/3ML Soln  Commonly known as:  DUONEB  Take 3 mLs by nebulization every 6 (six) hours as needed (wheezing).     levofloxacin 750 MG tablet  Commonly known as:  LEVAQUIN  Take 1 tablet (750 mg total) by mouth every other day.  Start taking on:  10/27/2014     potassium chloride SA 20 MEQ tablet  Commonly known as:  K-DUR,KLOR-CON  Take 1 tablet by mouth daily as needed. With lasix     predniSONE 20 MG tablet  Commonly known as:  DELTASONE  Take 2 tablets (40 mg total) by mouth daily with breakfast.  Start taking on:  10/27/2014     PROAIR HFA 108 (90 BASE) MCG/ACT inhaler  Generic drug:  albuterol    Take 1 puff by mouth every 6 (six) hours as needed for wheezing.     rOPINIRole 2 MG tablet  Commonly known as:  REQUIP  Take 1 mg by mouth 2 (two) times daily. Takes  tablet daily at dinnertime and  tablet every night at bedtime.     traMADol 50 MG tablet  Commonly known as:  ULTRAM  Take 50-100 mg by mouth 3 (three) times daily as needed for moderate pain.       Day of Discharge BP 142/63 mmHg  Pulse 84  Temp(Src) 98 F (36.7 C) (Oral)  Resp 19  Ht 5\' 7"  (1.702 m)  Wt 64.3 kg (141 lb 12.1 oz)  BMI 22.20 kg/m2  SpO2 97%  Physical Exam: General: No acute respiratory distress - conversant w/o difficulty  Lungs: Clear to auscultation bilaterally without wheezes or crackles  Cardiovascular: Regular rate and rhythm without murmur gallop or rub normal S1 and S2 Abdomen: Nontender, nondistended, soft, bowel sounds positive, no rebound, no ascites, no appreciable mass Extremities: No significant cyanosis, clubbing, or edema bilateral lower extremities  Basic Metabolic Panel:  Recent Labs Lab 10/24/14 1404 10/24/14 2047 10/25/14 0248 10/26/14 0416  NA 143  --  144 137  K 4.3  --  4.9 4.8  CL 110  --  111 104  CO2 28  --  25 26  GLUCOSE 144*  --  135* 189*  BUN 29*  --  42* 46*  CREATININE 1.04 1.68* 1.89* 1.60*  CALCIUM 9.2  --  8.9 8.5*   Liver Function Tests:  Recent Labs Lab 10/24/14 1404  AST 29  ALT 26  ALKPHOS 66  BILITOT 0.9  PROT 7.2  ALBUMIN 4.3   CBC:  Recent Labs Lab 10/24/14 1404 10/24/14 2047 10/25/14 0248 10/26/14 0416  WBC 19.1* 11.1* 7.8 15.6*  NEUTROABS 13.7*  --   --   --   HGB 15.9 14.2 13.6 12.5*  HCT 48.3 44.8 42.0 38.9*  MCV 97.8 99.3 99.1 97.5  PLT 191 145* 149* 152    Cardiac Enzymes:  Recent Labs Lab 10/24/14 1404 10/25/14 1220 10/25/14 1840 10/26/14 0416  TROPONINI <0.03 <0.03 <0.03 0.04*   BNP (last 3 results)  Recent Labs  10/24/14 1404  BNP 71.9    CBG:  Recent Labs Lab 10/26/14 0743  GLUCAP  134*    Recent Results (from the past 240 hour(s))  MRSA PCR Screening     Status: None   Collection Time: 10/24/14  7:40 PM  Result Value Ref Range Status   MRSA by PCR NEGATIVE NEGATIVE Final    Comment:        The  GeneXpert MRSA Assay (FDA approved for NASAL specimens only), is one component of a comprehensive MRSA colonization surveillance program. It is not intended to diagnose MRSA infection nor to guide or monitor treatment for MRSA infections.     Time spent in discharge (includes decision making & examination of pt): >35 minutes  10/26/2014, 10:34 AM   Lonia Blood, MD [facility] Office  [phone] Pager [phone]  On-Call/Text Page:      [email or web address]      [facility]

## 2014-10-27 ENCOUNTER — Encounter: Payer: Self-pay | Admitting: Adult Health

## 2014-10-27 ENCOUNTER — Ambulatory Visit (INDEPENDENT_AMBULATORY_CARE_PROVIDER_SITE_OTHER): Payer: Medicare Other | Admitting: Adult Health

## 2014-10-27 VITALS — BP 138/86 | HR 89 | Temp 98.1°F | Ht 67.0 in | Wt 151.0 lb

## 2014-10-27 DIAGNOSIS — J441 Chronic obstructive pulmonary disease with (acute) exacerbation: Secondary | ICD-10-CM | POA: Diagnosis not present

## 2014-10-27 MED ORDER — PREDNISONE 10 MG PO TABS: ORAL_TABLET | ORAL | Status: DC

## 2014-10-27 MED ORDER — LEVALBUTEROL HCL 0.63 MG/3ML IN NEBU
0.6300 mg | INHALATION_SOLUTION | Freq: Once | RESPIRATORY_TRACT | Status: AC
  Administered 2014-10-27: 0.63 mg via RESPIRATORY_TRACT

## 2014-10-27 MED ORDER — METHYLPREDNISOLONE ACETATE 80 MG/ML IJ SUSP
120.0000 mg | Freq: Once | INTRAMUSCULAR | Status: AC
  Administered 2014-10-27: 120 mg via INTRAMUSCULAR

## 2014-10-27 NOTE — Progress Notes (Signed)
Chart and office note reviewed in detail along with available xrays/ labs > agree with a/p as outlined  - he is extremely stubborn re role the cigarettes are playing in active AB and until gets past this unlikely to quit smoking long enough to prove this right or wrong

## 2014-10-27 NOTE — Assessment & Plan Note (Signed)
Exacerbation -slow to resolve with recent hospitalization with acute hypercarbic/hypoxic RF  He is improved but slow recovery back to his baseline, he is stable in office and at rest in distress.  Walking into exam room with no desats, recent CXR w/ no sign of PNA. He does not appear to be toxic today  However he is high risk for decompensation . Will have him finish abs and give slow taper of steroids  xopenex neb given in office and solumedrol 120mg  IM x 1  Discussed with family and pt that if not improving will need to go back to hospital  If worse to ER /admit   Plan  Finish Levaquin .  Prednisone taper over next week.  Mucinex DM Twice daily  As needed  Cough/congestion  follow up in 1 week with Dr. Sherene SiresWert  And As needed   Please contact office for sooner follow up if symptoms do not improve or worsen or seek emergency care  If not improving in 1-2 days will need hospital admission.  If worse will need to be admitted , call our office or EMS /Emergency room.

## 2014-10-27 NOTE — Addendum Note (Signed)
Addended by: Karalee HeightOX, Darrik Richman P on: 10/27/2014 05:30 PM   Modules accepted: Orders

## 2014-10-27 NOTE — Patient Instructions (Addendum)
Finish Levaquin .  Prednisone taper over next week.  Mucinex DM Twice daily  As needed  Cough/congestion  follow up in 1 week with Dr. Sherene SiresWert  And As needed   Please contact office for sooner follow up if symptoms do not improve or worsen or seek emergency care  If not improving in 1-2 days will need hospital admission.  If worse will need to be admitted , call our office or EMS /Emergency room.

## 2014-10-27 NOTE — Progress Notes (Signed)
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 Follow-up    Denies any breathing issues at this time.   uses proair 1st thing in am and about 3 x daily total but never noct rec Continue anoro   07/03/2014 f/u ov/Wert re: GOLD II COPD / anoro and prn proair  Chief Complaint  Patient presents with  . Acute Visit    Pt states that his breathing worsened 3-4 weeks ago. Pt c/o increased SOB with little exertion, chest tightness, cough and wheezing. Sone congestion noted- clear mucus. Denies SOB at rest.   sleeps fine but can't walk across the room s sob/ tried symb and helped but didn't stay on it "only worked while I was on it" / cough really not that bad  bad Every since exposure to the combine dust/ exhaust he's done poorly > was treated initially with pred "that's when my problem started" > apparently extensive cardiac w/u in [place] neg. rec Symbicort 160 Take 2 puffs first thing in am and then another 2 puffs about 12 hours later.  Only use your albuterol Proair)  as a rescue medication   Stop anoro  The key is to stop smoking completely before smoking completely stops you - this is by far the most important aspect of your care  Please schedule a follow up office visit in 4 weeks, sooner if needed with all medications you use in two bags - automatic vs as needed      08/01/2014 f/u ov/Wert re: copd  II/ brought all meds  Chief Complaint  Patient presents with  . Follow-up    Pt states his breathing has improved some. He is still c/o some chest tightness, wheezing and cough but states these symptoms have improved some since the last visit. He is using proair approx 3 x per day.   less doe / never sob at rest or noct     10/27/2014 Post hospital follow up  Pt returns for follow up from recent hospital follow up  He was admitted 10/14 to 10/16 for COPD exacerbation with acute hypercarbic/hypoxic RF requiring nitally BIPAP support. Initial ph 7.185  and PCO2 71 . He improved with aggressive abx, steroids and nebs.  He was discharged on prednisone, Levaquin 750mg  . CXR w/ chronic changes. Minimal desats with walking at 88-90%.  He says he is feeling better but still very sob and has some wheezing. Taking proair and duoneb but only uses half dose. We discussed using these with correct full dose if needed.  Only has couple of prednisone tabs left.  Sats today in office 93% on RA .  No fever or discolored mucus .  Appetite is okay .      Current Medications, Allergies, Complete Past Medical History, Past Surgical History, Family History, and Social History were reviewed in  [facility] electronic medical record.  ROS  The following are not active complaints unless bolded sore throat, dysphagia, dental problems, itching, sneezing,  nasal congestion or excess/ purulent secretions, ear ache,   fever, chills, sweats, unintended wt loss, pleuritic or exertional cp, hemoptysis,  orthopnea pnd or leg swelling, presyncope, palpitations, heartburn, abdominal pain, anorexia, nausea, vomiting, diarrhea  or change in bowel or urinary habits, change in stools or urine, dysuria,hematuria,  rash,  visual complaints, headache, numbness weakness or ataxia or problems with walking or coordination,  change in mood/affect or memory.                 Objective:   Physical Exam  amb min hoarse wm nad/  brighter affect    10/11/2013        146 > 11/11/2013  149 > 02/10/2014   149 > 07/03/2014  148 > 08/01/2014   144 >151  10/27/2014      HEENT: nl dentition, turbinates, and orophanx. Nl external ear canals without cough reflex   NECK :  without JVD/Nodes/TM/ nl carotid upstrokes bilaterally   LUNGS: no acc muscle use, few rhonchi and exp wheezing ,   CV:  RRR  no s3 or murmur or increase in P2, no edema   ABD:  soft and nontender with nl excursion in the supine position. No bruits or organomegaly, bowel sounds nl  MS:  warm without deformities, calf tenderness, cyanosis or clubbing  SKIN: warm and dry without lesions    NEURO:  alert, approp, no deficits         .       Assessment & Plan:

## 2014-11-03 ENCOUNTER — Ambulatory Visit (INDEPENDENT_AMBULATORY_CARE_PROVIDER_SITE_OTHER): Payer: Medicare Other | Admitting: Internal Medicine

## 2014-11-03 ENCOUNTER — Encounter: Payer: Self-pay | Admitting: Internal Medicine

## 2014-11-03 VITALS — BP 122/86 | HR 99 | Ht 67.0 in | Wt 145.8 lb

## 2014-11-03 DIAGNOSIS — F17210 Nicotine dependence, cigarettes, uncomplicated: Secondary | ICD-10-CM

## 2014-11-03 DIAGNOSIS — J449 Chronic obstructive pulmonary disease, unspecified: Secondary | ICD-10-CM | POA: Diagnosis not present

## 2014-11-03 MED ORDER — UMECLIDINIUM BROMIDE 62.5 MCG/INH IN AEPB: 1.0000 | INHALATION_SPRAY | Freq: Every morning | RESPIRATORY_TRACT | Status: DC

## 2014-11-03 NOTE — Progress Notes (Signed)
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 Follow-up    Denies any breathing issues at this time.   uses proair 1st thing in am and about 3 x daily total but never noct rec Continue anoro   07/03/2014 f/u ov/Willean Schurman re: GOLD II COPD / anoro and prn proair  Chief Complaint  Patient presents with  . Acute Visit    Pt states that his breathing worsened 3-4 weeks ago. Pt c/o increased SOB with little exertion, chest tightness, cough and wheezing. Sone congestion noted- clear mucus. Denies SOB at rest.   sleeps fine but can't walk across the room s sob/ tried symb and helped but didn't stay on it "only worked while I was on it" / cough really not that bad  bad Every since exposure to the combine dust/ exhaust he's done poorly > was treated initially with pred "that's when my problem started" > apparently extensive cardiac w/u in [place] neg. rec Symbicort 160 Take 2 puffs first thing in am and then another 2 puffs about 12 hours later.  Only use your albuterol Proair)  as a rescue medication   Stop anoro  The key is to stop smoking completely before smoking completely stops you - this is by far the most important aspect of your care  Please schedule a follow up office visit in 4 weeks, sooner if needed with all medications you use in two bags - automatic vs as needed      08/01/2014 f/u ov/Winslow Verrill re: copd  II/ brought all meds  Chief Complaint  Patient presents with  . Follow-up    Pt states his breathing has improved some. He is still c/o some chest tightness, wheezing and cough but states these symptoms have improved some since the last visit. He is using proair approx 3 x per day.   less doe / never sob at rest or noct     10/27/2014 Post hospital follow up  Pt returns for follow up from recent hospital follow up  He was admitted 10/14 to 10/16 for COPD exacerbation with acute hypercarbic/hypoxic RF requiring nitally BIPAP support. Initial ph 7.185  and PCO2 71 . He improved with aggressive abx, steroids and nebs.  He was discharged on prednisone, Levaquin  . CXR w/ chronic changes. Minimal desats with walking at 88-90%.  He says he is feeling better but still very sob and has some wheezing. Taking proair and duoneb but only uses half dose. We discussed using these with correct full dose if needed.  Only has couple of prednisone tabs left.  Sats today in office 93% on RA .  No fever or discolored mucus .  rec Finish Levaquin .  Prednisone taper over next week.  Mucinex DM Twice daily  As needed  Cough/congestion     11/03/2014  f/u  ov/Derren Suydam re: GOLD II COPD  / resumed smiking/ maint on symbicort no lama yet Chief Complaint  Patient presents with  . Follow-up    Pt here for 1 week f/u after seeing TP. Pt has completed abx. Pt states he feels improved. Pt states his cough has improved but not resolved - still productive in morning clear mucus in color, c/o mild chest tightness when he is SOB, pt states his SOB is at baseline.   avg day needs duoneb twice daily  Min am cough not purulent   No obvious day to day or daytime variability or assoc cp or chest tightness, subjective wheeze or overt sinus or hb symptoms. No unusual exp hx or h/o childhood pna/ asthma or knowledge of premature birth.  Sleeping ok without nocturnal  or early am exacerbation  of respiratory  c/o's or need for  noct saba. Also denies any obvious fluctuation of symptoms with weather or environmental changes or other aggravating or alleviating factors except as outlined above   Current Medications, Allergies, Complete Past Medical History, Past Surgical History, Family History, and Social History were reviewed in [facility] electronic medical record.  ROS  The following are not active complaints unless bolded sore throat, dysphagia, dental problems, itching, sneezing,  nasal congestion or excess/ purulent secretions, ear ache,   fever, chills, sweats, unintended wt loss, classically pleuritic or exertional cp, hemoptysis,  orthopnea pnd or leg swelling, presyncope, palpitations, abdominal pain, anorexia, nausea, vomiting, diarrhea  or change in bowel or bladder habits, change in stools or urine, dysuria,hematuria,  rash, arthralgias, visual complaints, headache, numbness, weakness or ataxia or problems with walking or coordination,  change in mood/affect or memory.           Objective:   Physical Exam  amb min hoarse wm nad   10/11/2013        146 > 11/11/2013  149 > 02/10/2014   149 > 07/03/2014  148 > 08/01/2014   144 >151  10/27/2014>  11/03/2014  146       HEENT: nl dentition, turbinates, and orophanx. Nl external ear canals without cough reflex   NECK :  without JVD/Nodes/TM/ nl carotid upstrokes bilaterally   LUNGS: no acc muscle use,      CV:  RRR  no s3 or murmur or increase in P2, no edema   ABD:  soft and nontender with nl excursion in the supine position. No bruits or organomegaly, bowel sounds nl  MS:  warm without deformities, calf tenderness, cyanosis or clubbing  SKIN: warm and dry without lesions    NEURO:  alert, approp, no deficits     I personally reviewed images and agree with radiology impression as follows:  CXR:  10/24/14 Emphysema without evidence of acute cardiopulmonary disease          .       Assessment & Plan:

## 2014-11-03 NOTE — Patient Instructions (Signed)
The key is to stop smoking completely before smoking completely stops you!   Plan A = Automatic = symbicort 160 Take 2 puffs first thing in am and then another 2 puffs about 12 hours later.                                      Incruse each am   Plan B = Backup Only use your albuterol (proair) as a rescue medication to be used if you can't catch your breath by resting or doing a relaxed purse lip breathing pattern.  - The less you use it, the better it will work when you need it. - Ok to use up to 2 puffs  every 4 hours if you must but call for immediate appointment if use goes up over your usual need - Don't leave home without it !!  (think of it like the spare tire for your car)   Plan C = crisis - Use duoneb up to every 4 hours if needed if plan B doesn't work   Please schedule a follow up office visit in 4 weeks, sooner if needed

## 2014-11-10 NOTE — Assessment & Plan Note (Signed)
>   3 min  Again reviewed I took an extended  opportunity with this patient to outline the consequences of continued cigarette use  in airway disorders based on all the data we have from the multiple national lung health studies (perfomed over decades at millions of dollars in cost)  indicating that smoking cessation, not choice of inhalers or physicians, is the most important aspect of care.    All we do with the meds is smooth out the curve, we don't change the ultimate outcome here, that's up to him and at present he still is not fully committed to quitting

## 2014-11-10 NOTE — Assessment & Plan Note (Addendum)
-   started breo and [place] 01/17/2013  > changed to anoro 11/11/2013 as clinically more fixed obst  - PFT's  02/19/2013   1.48 (61%) ratio 45 and DLCO 65% and corrects to 72  - pt requested 02 be d/cd as hasn't used it in months and was rec by previoius hospitalist > d/c 11/26/2013  - 2/12016  Walked RA x 3 laps @ 185 ft each stopped due to  Knee pain, no desat, no sob @ moderate pace  - 07/03/2014 p extensive coaching HFA effectiveness =    90%> symbicort 160 2bid  - 11/03/2014 added incruse   DDX of  difficult airways management all start with A and  include Adherence, Ace Inhibitors, Acid Reflux, Active Sinus Disease, Alpha 1 Antitripsin deficiency, Anxiety masquerading as Airways dz,  ABPA,  allergy(esp in young), Aspiration (esp in elderly), Adverse effects of meds,  Active smokers, A bunch of PE's (a small clot burden can't cause this syndrome unless there is already severe underlying pulm or vascular dz with poor reserve) plus two Bs  = Bronchiectasis and Beta blocker use..and one C= CHF   Adherence is always the initial "prime suspect" and is a multilayered concern that requires a "trust but verify" approach in every patient - starting with knowing how to use medications, especially inhalers, correctly, keeping up with refills and understanding the fundamental difference between maintenance and prns vs those medications only taken for a very short course and then stopped and not refilled.  - The proper method of use, as well as anticipated side effects, of a dry powdered  inhaler werre discussed and demonstrated to the patient. Improved effectiveness after extensive coaching during this visit to a level of approximately  90% > add incruse   Active smoking biggest concern > see sep a/p  I had an extended discussion with the patient and wife reviewing all relevant studies completed to date and  lasting 15 to 20 minutes of a 25 minute visit    Each maintenance medication was reviewed in detail  including most importantly the difference between maintenance and prns and under what circumstances the prns are to be triggered using an action plan format that is not reflected in the computer generated alphabetically organized AVS.    Please see instructions for details which were reviewed in writing and the patient given a copy highlighting the part that I personally wrote and discussed at today's ov.

## 2014-11-18 ENCOUNTER — Telehealth: Payer: Self-pay | Admitting: Internal Medicine

## 2014-11-18 NOTE — Telephone Encounter (Signed)
Initiated PA for Incruse thru CMM. Key: [id number] Sent for review. Will await response

## 2014-11-18 NOTE — Telephone Encounter (Signed)
Spoke with Joe Gonzalez at [facility], Incruse was denied by insurance and is requiring PA.  Samples placed up front for pt to pick up PA is being faxed to main fax #  Will hold in triage to await fax.

## 2014-11-19 NOTE — Telephone Encounter (Signed)
Called [employer]  Incruse denied and no other listed alternatives  Dr. Sherene SiresWert, please advise thanks

## 2014-11-19 NOTE — Telephone Encounter (Signed)
LMTCB

## 2014-11-19 NOTE — Telephone Encounter (Signed)
Needs to pick up samples of incruse and schedule ov with med formulary in hand before the samples run out

## 2014-11-19 NOTE — Telephone Encounter (Signed)
Dominic PeaJulie - [employer] [phone]- [phone] Non-formulary exception request for Richard Miuncruz Elipta has been denied coverage. Letter will be sent out to patient and provider.   If there are any questions call back at [phone].

## 2014-11-20 NOTE — Telephone Encounter (Signed)
Pt picked up Incruse samples (x2) on 11/19/14 and scheduled an OV with MW for 12/01/14. Pt states that he was not given a medication formulary this year - I advised him to contact his insurance [employer] to request this.  After ending phone call I researched online and found 2016 [facility]ul[facility] - please advise Dr Sherene SiresWert of alternative to Incruse. Formulary is in MW look at. Thanks.

## 2014-11-21 NOTE — Telephone Encounter (Signed)
Dr. Wert, please advise. Thanks!  

## 2014-11-26 NOTE — Telephone Encounter (Signed)
Attempted to call pt Line busy and could not leave message.  Will call back tomorrow

## 2014-11-26 NOTE — Telephone Encounter (Signed)
spiriva respimat best option but he will need ov to teach it so best option is give enough incruse x 4 weeks or whenever next of is scheduled

## 2014-11-26 NOTE — Telephone Encounter (Signed)
Dr. Sherene SiresWert, please advise as to alternative to Incruse, patient's formulary is in your look at folder.

## 2014-11-27 NOTE — Telephone Encounter (Signed)
Called and spoke with pt. Reviewed MW's results and recs. Pt stated he had 5 days left of incruse and that will last him until his ov on 12/01/14 at 9:00am with MW. Pt voiced understanding and had no further questions. Nothing further needed. Will sign off on message.

## 2014-12-01 ENCOUNTER — Encounter: Payer: Self-pay | Admitting: Internal Medicine

## 2014-12-01 ENCOUNTER — Ambulatory Visit (INDEPENDENT_AMBULATORY_CARE_PROVIDER_SITE_OTHER): Payer: Medicare Other | Admitting: Internal Medicine

## 2014-12-01 VITALS — BP 152/88 | HR 96 | Ht 67.0 in | Wt 147.6 lb

## 2014-12-01 DIAGNOSIS — F17210 Nicotine dependence, cigarettes, uncomplicated: Secondary | ICD-10-CM

## 2014-12-01 DIAGNOSIS — Z720 Tobacco use: Secondary | ICD-10-CM

## 2014-12-01 DIAGNOSIS — J449 Chronic obstructive pulmonary disease, unspecified: Secondary | ICD-10-CM

## 2014-12-01 MED ORDER — TIOTROPIUM BROMIDE MONOHYDRATE 2.5 MCG/ACT IN AERS: INHALATION_SPRAY | RESPIRATORY_TRACT | Status: AC

## 2014-12-01 NOTE — Progress Notes (Signed)
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 Follow-up    Denies any breathing issues at this time.   uses proair 1st thing in am and about 3 x daily total but never noct rec Continue anoro   07/03/2014 f/u ov/Joe Gonzalez re: GOLD II COPD / anoro and prn proair  Chief Complaint  Patient presents with  . Acute Visit    Pt states that his breathing worsened 3-4 weeks ago. Pt c/o increased SOB with little exertion, chest tightness, cough and wheezing. Sone congestion noted- clear mucus. Denies SOB at rest.   sleeps fine but can't walk across the room s sob/ tried symb and helped but didn't stay on it "only worked while I was on it" / cough really not that bad  bad Every since exposure to the combine dust/ exhaust he's done poorly > was treated initially with pred "that's when my problem started" > apparently extensive cardiac w/u in [place] neg. rec Symbicort 160 Take 2 puffs first thing in am and then another 2 puffs about 12 hours later.  Only use your albuterol Proair)  as a rescue medication   Stop anoro  The key is to stop smoking completely before smoking completely stops you - this is by far the most important aspect of your care  Please schedule a follow up office visit in 4 weeks, sooner if needed with all medications you use in two bags - automatic vs as needed      08/01/2014 f/u ov/Joe Gonzalez re: copd  II/ brought all meds  Chief Complaint  Patient presents with  . Follow-up    Pt states his breathing has improved some. He is still c/o some chest tightness, wheezing and cough but states these symptoms have improved some since the last visit. He is using proair approx 3 x per day.   less doe / never sob at rest or noct     10/27/2014 Post hospital follow up  Pt returns for follow up from recent hospital follow up  He was admitted 10/14 to 10/16 for COPD exacerbation with acute hypercarbic/hypoxic RF requiring nitally BIPAP support. Initial ph 7.185  and PCO2 71 . He improved with aggressive abx, steroids and nebs.  He was discharged on prednisone, Levaquin  . CXR w/ chronic changes. Minimal desats with walking at 88-90%.  He says he is feeling better but still very sob and has some wheezing. Taking proair and duoneb but only uses half dose. We discussed using these with correct full dose if needed.  Only has couple of prednisone tabs left.  Sats today in office 93% on RA .  No fever or discolored mucus .  rec Finish Levaquin .  Prednisone taper over next week.  Mucinex DM Twice daily  As needed  Cough/congestion     11/03/2014  f/u  ov/Joe Gonzalez re: GOLD II COPD  / resumed smiking/ maint on symbicort no lama yet Chief Complaint  Patient presents with  . Follow-up    Pt here for 1 week f/u after seeing TP. Pt has completed abx. Pt states he feels improved. Pt states his cough has improved but not resolved - still productive in morning clear mucus in color, c/o mild chest tightness when he is SOB, pt states his SOB is at baseline.   avg day needs duoneb twice daily  Min am cough not purulent  rec The key is to stop smoking completely before smoking completely stops you!  Plan A = Automatic = symbicort 160 Take 2 puffs first thing in am and then another 2 puffs about 12 hours later.                                      Incruse each am  Plan B = Backup Only use your albuterol (proair) as  a rescue medication  Plan C = crisis - Use duoneb up to every 4 hours if needed if plan B doesn't work    12/01/2014  f/u ov/Joe Gonzalez re:  GOLD II /  Symb/incruse / still smoking  Chief Complaint  Patient presents with  . Follow-up    Pt states his breathing is unchanged. He states that he is coughing less. He uses proair 3 x daily on average and had not used neb.        No obvious day to day or daytime variability or assoc cp or chest tightness, subjective wheeze or overt sinus or hb symptoms. No unusual exp hx or h/o childhood pna/ asthma or knowledge of premature birth.  Sleeping ok without nocturnal  or early am exacerbation  of respiratory  c/o's or need for noct saba. Also denies any obvious fluctuation of symptoms with weather or environmental changes or other aggravating or alleviating factors except as outlined above   Current Medications, Allergies, Complete Past Medical History, Past Surgical History, Family History, and Social History were reviewed in [facility] electronic medical record.  ROS  The following are not active complaints unless bolded sore throat, dysphagia, dental problems, itching, sneezing,  nasal congestion or excess/ purulent secretions, ear ache,   fever, chills, sweats, unintended wt loss, classically pleuritic or exertional cp, hemoptysis,  orthopnea pnd or leg swelling, presyncope, palpitations, abdominal pain, anorexia, nausea, vomiting, diarrhea  or change in bowel or bladder habits, change in stools or urine, dysuria,hematuria,  rash, arthralgias, visual complaints, headache, numbness, weakness or ataxia or problems with walking or coordination,  change in mood/affect or memory.           Objective:   Physical Exam  amb min hoarse wm nad   10/11/2013        146 > 11/11/2013  149 > 02/10/2014   149 > 07/03/2014  148 > 08/01/2014   144 >151  10/27/2014>  11/03/2014  146 > 12/01/2014 148      HEENT: nl dentition, turbinates, and orophanx. Nl external ear  canals without cough reflex   NECK :  without JVD/Nodes/TM/ nl carotid upstrokes bilaterally   LUNGS: no acc muscle use,    Very distant bs with late exp wheeze   CV:  RRR  no s3 or murmur or increase in P2, no edema   ABD:  soft and nontender with nl excursion in the supine position. No  bruits or organomegaly, bowel sounds nl  MS:  warm without deformities, calf tenderness, cyanosis or clubbing  SKIN: warm and dry without lesions    NEURO:  alert, approp, no deficits     I personally reviewed images and agree with radiology impression as follows:  CXR:  10/24/14 Emphysema without evidence of acute cardiopulmonary disease          .       Assessment & Plan:

## 2014-12-01 NOTE — Patient Instructions (Addendum)
The key is to stop smoking completely before smoking completely stops you!   Plan A = Automatic = symbicort 160 Take 2 puffs first thing in am and then another 2 puffs about 12 hours later.                                      Spiriva 2 pffs after the symbicort   Plan B = Backup Only use your albuterol (proair) as a rescue medication to be used if you can't catch your breath by resting or doing a relaxed purse lip breathing pattern.  - The less you use it, the better it will work when you need it. - Ok to use up to 2 puffs  every 4 hours if you must but call for immediate appointment if use goes up over your usual need - Don't leave home without it !!  (think of it like the spare tire for your car)   Plan C = crisis - Use duoneb up to every 4 hours if needed if plan B doesn't work   Don't use your medicare D dollars for generics > consider using [email or web address] instead  Please schedule a follow up office visit in 4 weeks, sooner if needed

## 2014-12-05 NOTE — Assessment & Plan Note (Signed)
-   started breo and [place] 01/17/2013  > changed to anoro 11/11/2013 as clinically more fixed obst  - PFT's  02/19/2013   1.48 (61%) ratio 45 and DLCO 65% and corrects to 72  - pt requested 02 be d/cd as hasn't used it in months and was rec by previoius hospitalist > d/c 11/26/2013  - 2/12016  Walked RA x 3 laps @ 185 ft each stopped due to  Knee pain, no desat, no sob @ moderate pace  - 07/03/2014 p extensive coaching HFA effectiveness =    90%> symbicort 160 2bid  - 11/03/2014 added incruse but not covered     Overall improving with cutting down on smoking and taking meds consistently but running into problem with formulary restrictions  12/01/2014  extensive coaching HFA effectiveness =    90% with respimat so try spriva respimat  I had an extended discussion with the patient reviewing all relevant studies completed to date and  lasting 15 to 20 minutes of a 25 minute visit    Formulary restrictions will be an ongoing challenge for the forseable future and I would be happy to pick an alternative if the pt will first  provide me a list of them but pt  will need to return here for training for any new device that is required eg dpi vs hfa vs respimat.    In meantime we can always provide samples so the patient never runs out of any needed respiratory medications.   Each maintenance medication was reviewed in detail including most importantly the difference between maintenance and prns and under what circumstances the prns are to be triggered using an action plan format that is not reflected in the computer generated alphabetically organized AVS.    Please see instructions for details which were reviewed in writing and the patient given a copy highlighting the part that I personally wrote and discussed at today's ov.

## 2014-12-05 NOTE — Assessment & Plan Note (Signed)
>   3 m  He has made some progress in cutting down but not yet ready to quit > reinforced impt of complete cessation of possible

## 2014-12-29 ENCOUNTER — Encounter: Payer: Self-pay | Admitting: Internal Medicine

## 2014-12-29 ENCOUNTER — Ambulatory Visit (INDEPENDENT_AMBULATORY_CARE_PROVIDER_SITE_OTHER): Payer: Medicare Other | Admitting: Internal Medicine

## 2014-12-29 VITALS — BP 130/70 | HR 100 | Ht 67.0 in | Wt 147.8 lb

## 2014-12-29 DIAGNOSIS — Z720 Tobacco use: Secondary | ICD-10-CM

## 2014-12-29 DIAGNOSIS — F17210 Nicotine dependence, cigarettes, uncomplicated: Secondary | ICD-10-CM

## 2014-12-29 DIAGNOSIS — J449 Chronic obstructive pulmonary disease, unspecified: Secondary | ICD-10-CM | POA: Diagnosis not present

## 2014-12-29 MED ORDER — PREDNISONE 10 MG PO TABS: ORAL_TABLET | ORAL | Status: DC

## 2014-12-29 NOTE — Progress Notes (Signed)
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 Initial ph 7.185  and PCO2 71 . He improved with aggressive abx, steroids and nebs.  He was discharged on prednisone, Levaquin  . CXR w/ chronic changes. Minimal desats with walking at 88-90%.  He says he is feeling better but still very sob and has some wheezing. Taking proair and duoneb but only uses half dose. We discussed using these with correct full dose if needed.  Only has couple of prednisone tabs left.  Sats today in office 93% on RA .  No fever or discolored mucus .  rec Finish Levaquin .  Prednisone taper over next week.  Mucinex DM Twice daily  As needed  Cough/congestion     12/01/2014  f/u ov/Joe Gonzalez re:  GOLD II /  Symb/incruse / still smoking  Chief Complaint  Patient presents with  . Follow-up    Pt states his breathing is unchanged. He states that he is coughing less. He uses proair 3 x daily on average and had not used neb.   rec The key is to stop smoking completely before smoking completely stops you!  Plan A = Automatic = symbicort 160 Take 2 puffs first thing in am and then another 2 puffs about 12 hours later.                                      Spiriva 2 pffs after the symbicort  Plan B = Backup Only use your albuterol (proair) as a rescue medication Plan C = crisis - Use duoneb up to every 4 hours if needed if plan B doesn't work    12/29/2014  f/u ov/Joe Gonzalez re: symb/spiriva and  prn saba hfa/ neb  Chief Complaint  Patient presents with  . Follow-up    Pt states that his breathing is unchanged. He uses proair 3-4 x per day on average and has used neb 2-3 x since last visit.   MMRC2 = can't walk a nl pace on a flat grade s sob (see dyspnea as could not reproduce this cc at nl pace x > 450 ft     No obvious day to day or daytime variability or assoc cp or chest tightness, subjective wheeze or overt sinus or hb symptoms. No unusual exp hx or h/o childhood pna/ asthma or knowledge of premature birth.  Sleeping ok without nocturnal  or early am exacerbation  of respiratory  c/o's or need for noct saba. Also denies any obvious fluctuation of symptoms with weather or environmental changes or other aggravating or alleviating factors except as outlined above   Current Medications, Allergies, Complete Past Medical History, Past Surgical History, Family History, and Social History were reviewed in [employer] record.  ROS  The following are not active complaints unless bolded sore throat,  dysphagia, dental problems, itching, sneezing,  nasal congestion or excess/ purulent secretions, ear ache,   fever, chills, sweats, unintended wt loss, classically pleuritic or exertional cp, hemoptysis,  orthopnea pnd or leg swelling, presyncope, palpitations, abdominal pain, anorexia, nausea, vomiting, diarrhea  or change in bowel or bladder habits, change in stools or urine, dysuria,hematuria,  rash, arthralgias, visual complaints, headache, numbness, weakness or ataxia or problems with walking or coordination,  change in mood/affect or memory.           Objective:   Physical Exam  amb min hoarse wm nad / vital signs reviewed   10/11/2013  146 > 11/11/2013  149 > 02/10/2014   149 > 07/03/2014  148 > 08/01/2014   144 >151  10/27/2014>  11/03/2014  146 > 12/01/2014 148  > 12/29/2014  148       HEENT: nl dentition, turbinates, and orophanx. Nl external ear canals without  cough reflex   NECK :  without JVD/Nodes/TM/ nl carotid upstrokes bilaterally   LUNGS: no acc muscle use,    Very distant bs with late exp wheeze   CV:  RRR  no s3 or murmur or increase in P2, no edema   ABD:  soft and nontender with nl excursion in the supine position. No bruits or organomegaly, bowel sounds nl  MS:  warm without deformities, calf tenderness, cyanosis or clubbing  SKIN: warm and dry without lesions    NEURO:  alert, approp, no deficits     I personally reviewed images and agree with radiology impression as follows:  CXR:  10/24/14 Emphysema without evidence of acute cardiopulmonary disease            Assessment & Plan:

## 2014-12-29 NOTE — Patient Instructions (Signed)
If you get worse > Prednisone 10 mg take  4 each am x 2 days,   2 each am x 2 days,  1 each am x 2 days and stop   Please schedule a follow up visit in 3 months but call sooner if needed

## 2015-01-06 NOTE — Assessment & Plan Note (Signed)
-   started breo and [place] 01/17/2013  > changed to anoro 11/11/2013 as clinically more fixed obst  - PFT's  02/19/2013   1.48 (61%) ratio 45 and DLCO 65% and corrects to 72  - pt requested 02 be d/cd as hasn't used it in months and was rec by previoius hospitalist > d/c 11/26/2013  - 2/12016  Walked RA x 3 laps @ 185 ft each stopped due to  Knee pain, no desat, no sob @ moderate pace  - 07/03/2014 p extensive coaching HFA effectiveness =    90%> symbicort 160 2bid  - 11/03/2014 added incruse  - 12/01/2014 changed incruse to spiriva hfa  - 12/29/2014  Walked RA x 3 laps @ 185 ft each stopped due to End of study, nl pace, no sob or desat      Despite smoking he is finally relatively well compensated on present regimen, albeit somewhat complex, the only way to modify it being stop  smoking and then return to regroup

## 2015-01-06 NOTE — Assessment & Plan Note (Signed)
>   3m Discussed the risks and costs (both direct and indirect)  of smoking relative to the benefits of quitting but patient unwilling to commit at this point to a specific quit date.    Offered to help with quitting by use of chantix or referral to our Quit Smart program when the patient is ready.  

## 2015-02-01 ENCOUNTER — Emergency Department (HOSPITAL_BASED_OUTPATIENT_CLINIC_OR_DEPARTMENT_OTHER): Payer: Medicare Other

## 2015-02-01 ENCOUNTER — Encounter (HOSPITAL_BASED_OUTPATIENT_CLINIC_OR_DEPARTMENT_OTHER): Payer: Self-pay | Admitting: Emergency Medicine

## 2015-02-01 ENCOUNTER — Observation Stay (HOSPITAL_BASED_OUTPATIENT_CLINIC_OR_DEPARTMENT_OTHER)
Admission: EM | Admit: 2015-02-01 | Discharge: 2015-02-02 | Disposition: A | Discharge status: Home / Self Care | Payer: Medicare Other | Attending: Internal Medicine | Admitting: Internal Medicine

## 2015-02-01 DIAGNOSIS — J441 Chronic obstructive pulmonary disease with (acute) exacerbation: Principal | ICD-10-CM | POA: Insufficient documentation

## 2015-02-01 DIAGNOSIS — I10 Essential (primary) hypertension: Secondary | ICD-10-CM | POA: Diagnosis not present

## 2015-02-01 DIAGNOSIS — F17210 Nicotine dependence, cigarettes, uncomplicated: Secondary | ICD-10-CM | POA: Diagnosis not present

## 2015-02-01 DIAGNOSIS — R0602 Shortness of breath: Secondary | ICD-10-CM | POA: Diagnosis present

## 2015-02-01 DIAGNOSIS — J449 Chronic obstructive pulmonary disease, unspecified: Secondary | ICD-10-CM

## 2015-02-01 LAB — CBC WITH DIFFERENTIAL/PLATELET
Basophils Absolute: 0.1 10*3/uL (ref 0.0–0.1)
Basophils Relative: 1 %
EOS ABS: 0.1 10*3/uL (ref 0.0–0.7)
EOS PCT: 1 %
HCT: 43.8 % (ref 39.0–52.0)
HEMOGLOBIN: 14.3 g/dL (ref 13.0–17.0)
LYMPHS ABS: 1.6 10*3/uL (ref 0.7–4.0)
Lymphocytes Relative: 16 %
MCH: 31.9 pg (ref 26.0–34.0)
MCHC: 32.6 g/dL (ref 30.0–36.0)
MCV: 97.8 fL (ref 78.0–100.0)
MONO ABS: 1.3 10*3/uL — AB (ref 0.1–1.0)
MONOS PCT: 12 %
Neutro Abs: 7.3 10*3/uL (ref 1.7–7.7)
Neutrophils Relative %: 70 %
PLATELETS: 177 10*3/uL (ref 150–400)
RBC: 4.48 MIL/uL (ref 4.22–5.81)
RDW: 13.6 % (ref 11.5–15.5)
WBC: 10.3 10*3/uL (ref 4.0–10.5)

## 2015-02-01 LAB — BASIC METABOLIC PANEL
Anion gap: 7 (ref 5–15)
BUN: 31 mg/dL — AB (ref 6–20)
CALCIUM: 9.2 mg/dL (ref 8.9–10.3)
CHLORIDE: 110 mmol/L (ref 101–111)
CO2: 27 mmol/L (ref 22–32)
CREATININE: 1.1 mg/dL (ref 0.61–1.24)
GFR calc Af Amer: >60 mL/min (ref >60)
GFR calc non Af Amer: >60 mL/min (ref >60)
Glucose, Bld: 119 mg/dL — ABNORMAL HIGH (ref 65–99)
Potassium: 3.9 mmol/L (ref 3.5–5.1)
SODIUM: 144 mmol/L (ref 135–145)

## 2015-02-01 LAB — I-STAT VENOUS BLOOD GAS, ED
BICARBONATE: 27.4 meq/L — AB (ref 20.0–24.0)
O2 Saturation: 85 %
PH VEN: 7.303 — AB (ref 7.250–7.300)
Patient temperature: 98
TCO2: 29 mmol/L (ref 0–100)
pCO2, Ven: 55.1 mmHg — ABNORMAL HIGH (ref 45.0–50.0)
pO2, Ven: 55 mmHg — ABNORMAL HIGH (ref 30.0–45.0)

## 2015-02-01 LAB — TROPONIN I: Troponin I: <0.03 ng/mL (ref <0.031)

## 2015-02-01 LAB — BRAIN NATRIURETIC PEPTIDE: B NATRIURETIC PEPTIDE 5: 46 pg/mL (ref 0.0–100.0)

## 2015-02-01 MED ORDER — MAGNESIUM SULFATE 2 GM/50ML IV SOLN
2.0000 g | Freq: Once | INTRAVENOUS | Status: AC
  Administered 2015-02-01: 2 g via INTRAVENOUS
  Filled 2015-02-01: qty 50

## 2015-02-01 MED ORDER — ALBUTEROL (5 MG/ML) CONTINUOUS INHALATION SOLN
10.0000 mg/h | INHALATION_SOLUTION | RESPIRATORY_TRACT | Status: AC
  Administered 2015-02-02: 10 mg/h via RESPIRATORY_TRACT
  Filled 2015-02-01: qty 20

## 2015-02-01 MED ORDER — METHYLPREDNISOLONE SODIUM SUCC 125 MG IJ SOLR
125.0000 mg | Freq: Once | INTRAMUSCULAR | Status: AC
  Administered 2015-02-01: 125 mg via INTRAVENOUS
  Filled 2015-02-01: qty 2

## 2015-02-01 MED ORDER — IPRATROPIUM-ALBUTEROL 0.5-2.5 (3) MG/3ML IN SOLN
3.0000 mL | Freq: Once | RESPIRATORY_TRACT | Status: AC
  Administered 2015-02-01: 3 mL via RESPIRATORY_TRACT
  Filled 2015-02-01: qty 3

## 2015-02-01 NOTE — ED Notes (Signed)
RT at [facility], neb in progress, MD in to room

## 2015-02-01 NOTE — ED Provider Notes (Addendum)
CSN: [id number]     Arrival date & time 02/01/15  2256 History  By signing my name below, I, Budd Palmer, attest that this documentation has been prepared under the direction and in the presence of Paula Libra, MD. Electronically Signed: Budd Palmer, ED Scribe. 02/01/2015. 11:21 PM.     Chief Complaint  Patient presents with  . Shortness of Breath   The history is provided by the patient. No language interpreter was used.   HPI Comments: Joe Gonzalez is a 82 y.o. male who presents to the Emergency Department complaining of worsening SOB of sudden onset 5 hours ago. He reports associated chest tightness but no frank pain. He has used Ipratropium bromide, albuterol, and Spiriva at home without relief. His symptoms are moderate to severe. He was noted to be tachycardic, tachypnea and hypoxic on arrival. He is not on oxygen at home. Pt denies fever, n/v/d and urinary symptoms.     Past Medical History  Diagnosis Date  . COPD (chronic obstructive pulmonary disease) (HCC)   . Hypertension    Past Surgical History  Procedure Laterality Date  . Hernia repair    . Back surgery    . Rotator cuff repair    . Knee arthroplasty     Family History  Problem Relation Age of Onset  . Asthma Mother    Social History  Substance Use Topics  . Smoking status: Current Every Day Smoker -- 0.25 packs/day for 40 years    Types: Cigarettes  . Smokeless tobacco: Never Used  . Alcohol Use: No    Review of Systems  All other systems reviewed and are negative.   Allergies  Review of patient's allergies indicates no known allergies.  Home Medications   Prior to Admission medications   Medication Sig Start Date End Date Taking? Authorizing Provider  albuterol (PROAIR HFA) 108 (90 BASE) MCG/ACT inhaler Take 1 puff by mouth every 6 (six) hours as needed for wheezing.  02/07/13   Historical Provider, MD  amLODipine (NORVASC) 5 MG tablet Take 5 mg by mouth daily.    Historical Provider, MD   aspirin EC 81 MG EC tablet Take 1 tablet (81 mg total) by mouth daily. 10/26/14   Lonia Blood, MD  budesonide-formoterol [facility]) 160-4.5 MCG/ACT inhaler Take 2 puffs first thing in am and then another 2 puffs about 12 hours later. Patient taking differently: Inhale 2 puffs into the lungs every 12 (twelve) hours. Take 2 puffs first thing in am and then another 2 puffs about 12 hours later. 08/14/14   [name], MD  doxazosin (CARDURA) 2 MG tablet Take 2 mg by mouth daily.    Historical Provider, MD  furosemide (LASIX) 20 MG tablet Take 1 tablet by mouth daily as needed. 12/23/13   Historical Provider, MD  ipratropium-albuterol (DUONEB) 0.5-2.5 (3) MG/3ML SOLN Take 3 mLs by nebulization every 6 (six) hours as needed (wheezing).    Historical Provider, MD  potassium chloride SA (K-DUR,KLOR-CON) 20 MEQ tablet Take 1 tablet by mouth daily as needed. With lasix 12/23/13   Historical Provider, MD  predniSONE (DELTASONE) 10 MG tablet Take  4 each am x 2 days,   2 each am x 2 days,  1 each am x 2 days and stop 12/29/14   [name], MD  rOPINIRole (REQUIP) 2 MG tablet Take 1 mg by mouth 2 (two) times daily. Takes  tablet daily at dinnertime and  tablet every night at bedtime.  Historical Provider, MD  Tiotropium Bromide Monohydrate (SPIRIVA RESPIMAT) 2.5 MCG/ACT AERS 2 pffs each am 12/01/14   [name], MD  traMADol (ULTRAM) 50 MG tablet Take 50-100 mg by mouth 3 (three) times daily as needed for moderate pain.     Historical Provider, MD   BP 133/66 mmHg  Pulse 87  Temp(Src) 98 F (36.7 C) (Oral)  Resp 17  Wt 147 lb (66.679 kg)  SpO2 88%   Physical Exam General: Well-developed, well-nourished male in moderate distress; appearance consistent with age of record HENT: normocephalic; atraumatic Eyes: pupils equal, round and reactive to light; extraocular muscles intact; lens implants Neck: supple Heart: regular rate and rhythm; tachycardia Lungs: expiratory wheezes  bilaterally with accessory muscle use and tachypnea Abdomen: soft; nondistended; nontender; no masses or hepatosplenomegaly; bowel sounds present Extremities: No deformity; full range of motion; pulses normal; trace edema of ankles Neurologic: Awake, alert and oriented; motor function intact in all extremities and symmetric; no facial droop Skin: Warm and dry Psychiatric: Normal mood and affect   ED Course  Procedures   CRITICAL CARE Performed by: Damier Disano L Total critical care time: 35 minutes Critical care time was exclusive of separately billable procedures and treating other patients. Critical care was necessary to treat or prevent imminent or life-threatening deterioration. Critical care was time spent personally by me on the following activities: development of treatment plan with patient and/or surrogate as well as nursing, discussions with consultants, evaluation of patient's response to treatment, examination of patient, obtaining history from patient or surrogate, ordering and performing treatments and interventions, ordering and review of laboratory studies, ordering and review of radiographic studies, pulse oximetry and re-evaluation of patient's condition.   MDM   Nursing notes and vitals signs, including pulse oximetry, reviewed.  Summary of this visit's results, reviewed by myself:  Labs:  Results for orders placed or performed during the hospital encounter of 02/01/15 (from the past 24 hour(s))  Troponin I     Status: None   Collection Time: 02/01/15 11:20 PM  Result Value Ref Range   Troponin I <0.03 <0.031 ng/mL  CBC with Differential     Status: Abnormal   Collection Time: 02/01/15 11:20 PM  Result Value Ref Range   WBC 10.3 4.0 - 10.5 K/uL   RBC 4.48 4.22 - 5.81 MIL/uL   Hemoglobin 14.3 13.0 - 17.0 g/dL   HCT 69.6 29.5 - 28.4 %   MCV 97.8 78.0 - 100.0 fL   MCH 31.9 26.0 - 34.0 pg   MCHC 32.6 30.0 - 36.0 g/dL   RDW 13.2 44.0 - 10.2 %   Platelets 177 150  - 400 K/uL   Neutrophils Relative % 70 %   Neutro Abs 7.3 1.7 - 7.7 K/uL   Lymphocytes Relative 16 %   Lymphs Abs 1.6 0.7 - 4.0 K/uL   Monocytes Relative 12 %   Monocytes Absolute 1.3 (H) 0.1 - 1.0 K/uL   Eosinophils Relative 1 %   Eosinophils Absolute 0.1 0.0 - 0.7 K/uL   Basophils Relative 1 %   Basophils Absolute 0.1 0.0 - 0.1 K/uL  Basic metabolic panel     Status: Abnormal   Collection Time: 02/01/15 11:20 PM  Result Value Ref Range   Sodium 144 135 - 145 mmol/L   Potassium 3.9 3.5 - 5.1 mmol/L   Chloride 110 101 - 111 mmol/L   CO2 27 22 - 32 mmol/L   Glucose, Bld 119 (H) 65 - 99 mg/dL   BUN  31 (H) 6 - 20 mg/dL   Creatinine, Ser 1.61 0.61 - 1.24 mg/dL   Calcium 9.2 8.9 - 09.6 mg/dL   GFR calc non Af Amer >60 >60 mL/min   GFR calc Af Amer >60 >60 mL/min   Anion gap 7 5 - 15  Brain natriuretic peptide     Status: None   Collection Time: 02/01/15 11:20 PM  Result Value Ref Range   B Natriuretic Peptide 46.0 0.0 - 100.0 pg/mL  I-Stat venous blood gas, ED     Status: Abnormal   Collection Time: 02/01/15 11:23 PM  Result Value Ref Range   pH, Ven 7.303 (H) 7.250 - 7.300   pCO2, Ven 55.1 (H) 45.0 - 50.0 mmHg   pO2, Ven 55.0 (H) 30.0 - 45.0 mmHg   Bicarbonate 27.4 (H) 20.0 - 24.0 mEq/L   TCO2 29 0 - 100 mmol/L   O2 Saturation 85.0 %   Patient temperature 98.0 F    Collection site IV START    Drawn by Nurse    Sample type VENOUS   I-Stat venous blood gas, ED     Status: Abnormal   Collection Time: 02/02/15  2:27 AM  Result Value Ref Range   pH, Ven 7.377 (H) 7.250 - 7.300   pCO2, Ven 49.1 45.0 - 50.0 mmHg   pO2, Ven 36.0 30.0 - 45.0 mmHg   Bicarbonate 29.0 (H) 20.0 - 24.0 mEq/L   TCO2 30 0 - 100 mmol/L   O2 Saturation 67.0 %   Acid-Base Excess 3.0 (H) 0.0 - 2.0 mmol/L   Patient temperature 98.0 F    Collection site IV START    Drawn by Nurse    Sample type VENOUS    Comment NOTIFIED PHYSICIAN     Imaging Studies: Dg Chest Portable 1 View  02/02/2015  CLINICAL  DATA:  Worsening shortness of breath. Sudden onset 5 hours ago. Chest tightness. Bilateral lower extremity swelling. Wheezing. EXAM: PORTABLE CHEST 1 VIEW COMPARISON:  10/24/2014 FINDINGS: Normal heart size and pulmonary vascularity. Emphysematous changes in the lungs. Linear fibrosis or atelectasis in the mid lungs and lung bases. No focal airspace disease or consolidation. No blunting of costophrenic angles. No pneumothorax. Calcified and tortuous aorta. Postoperative changes in both shoulders. IMPRESSION: Emphysematous changes and scattered fibrosis in the lungs. No evidence of active pulmonary disease. Electronically Signed   By: [name] M.D.   On: 02/02/2015 00:18   1:47 AM Patient placed on BiPAP after initial evaluation and was administered a continuous albuterol neb treatment. He is now off BiPAP breathing comfortably. Wheezing has resolved. There is no longer any accessory muscle use. We will be repeating a blood gas at 2 AM.  2:45 AM Patient continues to breathe comfortably off BiPAP. His oxygen saturation has dropped as low as 88% on room air while sleeping so he is now on nasal cannula. We will have him admitted to the hospitalist service in light of his age and severity on presentation.     I personally performed the services described in this documentation, which was scribed in my presence. The recorded information has been reviewed and is accurate.   Paula Libra, MD 02/02/15 0454  Paula Libra, MD 02/02/15 [phone]

## 2015-02-01 NOTE — ED Notes (Signed)
Patient has had increasing SOb since 6 pm today. Patient is has dyspnea and audible wheezing with long expiratory phase. Patient having SOb to the point that his is not able to talk full sentences.

## 2015-02-01 NOTE — ED Notes (Signed)
pCXR unavailable, EDP aware of delay, CXR to be done in department in 1hr, mag infusing, 1hr CAT neb inprogress via bipap, solumedrol given. VSS.

## 2015-02-02 ENCOUNTER — Encounter (HOSPITAL_COMMUNITY): Payer: Self-pay | Admitting: *Deleted

## 2015-02-02 DIAGNOSIS — Z720 Tobacco use: Secondary | ICD-10-CM

## 2015-02-02 DIAGNOSIS — J441 Chronic obstructive pulmonary disease with (acute) exacerbation: Secondary | ICD-10-CM | POA: Diagnosis not present

## 2015-02-02 DIAGNOSIS — I10 Essential (primary) hypertension: Secondary | ICD-10-CM

## 2015-02-02 LAB — I-STAT VENOUS BLOOD GAS, ED
Acid-Base Excess: 3 mmol/L — ABNORMAL HIGH (ref 0.0–2.0)
Bicarbonate: 29 mEq/L — ABNORMAL HIGH (ref 20.0–24.0)
O2 Saturation: 67 %
PCO2 VEN: 49.1 mmHg (ref 45.0–50.0)
PH VEN: 7.377 — AB (ref 7.250–7.300)
TCO2: 30 mmol/L (ref 0–100)
pO2, Ven: 36 mmHg (ref 30.0–45.0)

## 2015-02-02 LAB — MRSA PCR SCREENING: MRSA BY PCR: NEGATIVE

## 2015-02-02 LAB — TSH: TSH: 0.21 u[IU]/mL — ABNORMAL LOW (ref 0.350–4.500)

## 2015-02-02 MED ORDER — ALBUTEROL SULFATE (2.5 MG/3ML) 0.083% IN NEBU
2.5000 mg | INHALATION_SOLUTION | RESPIRATORY_TRACT | Status: DC
  Administered 2015-02-02 (×2): 2.5 mg via RESPIRATORY_TRACT
  Filled 2015-02-02 (×2): qty 3

## 2015-02-02 MED ORDER — POTASSIUM CHLORIDE CRYS ER 20 MEQ PO TBCR: 20.0000 meq | EXTENDED_RELEASE_TABLET | Freq: Every day | ORAL | Status: DC | PRN

## 2015-02-02 MED ORDER — AZITHROMYCIN 250 MG PO TABS: ORAL_TABLET | ORAL | Status: DC

## 2015-02-02 MED ORDER — BUDESONIDE 0.5 MG/2ML IN SUSP
0.5000 mg | Freq: Two times a day (BID) | RESPIRATORY_TRACT | Status: DC
  Administered 2015-02-02: 0.5 mg via RESPIRATORY_TRACT
  Filled 2015-02-02: qty 2

## 2015-02-02 MED ORDER — GUAIFENESIN ER 600 MG PO TB12
600.0000 mg | ORAL_TABLET | Freq: Two times a day (BID) | ORAL | Status: DC
  Administered 2015-02-02: 600 mg via ORAL
  Filled 2015-02-02: qty 1

## 2015-02-02 MED ORDER — DOXAZOSIN MESYLATE 2 MG PO TABS
2.0000 mg | ORAL_TABLET | Freq: Every day | ORAL | Status: DC
  Administered 2015-02-02: 2 mg via ORAL
  Filled 2015-02-02: qty 1

## 2015-02-02 MED ORDER — ALBUTEROL SULFATE (2.5 MG/3ML) 0.083% IN NEBU: 2.5000 mg | INHALATION_SOLUTION | RESPIRATORY_TRACT | Status: DC | PRN

## 2015-02-02 MED ORDER — SODIUM CHLORIDE 0.9 % IV SOLN: INTRAVENOUS | Status: DC

## 2015-02-02 MED ORDER — TIOTROPIUM BROMIDE MONOHYDRATE 18 MCG IN CAPS
1.0000 | ORAL_CAPSULE | Freq: Every day | RESPIRATORY_TRACT | Status: DC
  Administered 2015-02-02: 18 ug via RESPIRATORY_TRACT
  Filled 2015-02-02: qty 5

## 2015-02-02 MED ORDER — ALBUTEROL SULFATE (2.5 MG/3ML) 0.083% IN NEBU
2.5000 mg | INHALATION_SOLUTION | RESPIRATORY_TRACT | Status: DC | PRN
  Administered 2015-02-02: 2.5 mg via RESPIRATORY_TRACT
  Filled 2015-02-02: qty 3

## 2015-02-02 MED ORDER — FUROSEMIDE 20 MG PO TABS: 20.0000 mg | ORAL_TABLET | Freq: Every day | ORAL | Status: DC | PRN

## 2015-02-02 MED ORDER — PREDNISONE 10 MG PO TABS: ORAL_TABLET | ORAL | Status: AC

## 2015-02-02 MED ORDER — IPRATROPIUM-ALBUTEROL 0.5-2.5 (3) MG/3ML IN SOLN: 3.0000 mL | RESPIRATORY_TRACT | Status: DC

## 2015-02-02 MED ORDER — ONDANSETRON HCL 4 MG/2ML IJ SOLN: 4.0000 mg | Freq: Four times a day (QID) | INTRAMUSCULAR | Status: DC | PRN

## 2015-02-02 MED ORDER — ARFORMOTEROL TARTRATE 15 MCG/2ML IN NEBU
15.0000 ug | INHALATION_SOLUTION | Freq: Two times a day (BID) | RESPIRATORY_TRACT | Status: DC
  Administered 2015-02-02: 15 ug via RESPIRATORY_TRACT
  Filled 2015-02-02: qty 2

## 2015-02-02 MED ORDER — ASPIRIN EC 81 MG PO TBEC
81.0000 mg | DELAYED_RELEASE_TABLET | Freq: Every day | ORAL | Status: DC
  Administered 2015-02-02: 81 mg via ORAL
  Filled 2015-02-02: qty 1

## 2015-02-02 MED ORDER — GUAIFENESIN ER 600 MG PO TB12: 600.0000 mg | ORAL_TABLET | Freq: Two times a day (BID) | ORAL | Status: AC

## 2015-02-02 MED ORDER — ENOXAPARIN SODIUM 40 MG/0.4ML ~~LOC~~ SOLN
40.0000 mg | SUBCUTANEOUS | Status: DC
  Administered 2015-02-02: 40 mg via SUBCUTANEOUS
  Filled 2015-02-02: qty 0.4

## 2015-02-02 MED ORDER — ROPINIROLE HCL 1 MG PO TABS
1.0000 mg | ORAL_TABLET | Freq: Two times a day (BID) | ORAL | Status: DC
  Administered 2015-02-02: 1 mg via ORAL
  Filled 2015-02-02: qty 1

## 2015-02-02 MED ORDER — AMLODIPINE BESYLATE 5 MG PO TABS
5.0000 mg | ORAL_TABLET | Freq: Every day | ORAL | Status: DC
  Administered 2015-02-02: 5 mg via ORAL
  Filled 2015-02-02: qty 1

## 2015-02-02 MED ORDER — PREDNISONE 50 MG PO TABS
60.0000 mg | ORAL_TABLET | Freq: Every day | ORAL | Status: DC
  Administered 2015-02-02: 60 mg via ORAL
  Filled 2015-02-02: qty 1

## 2015-02-02 MED ORDER — IPRATROPIUM-ALBUTEROL 0.5-2.5 (3) MG/3ML IN SOLN: 3.0000 mL | Freq: Four times a day (QID) | RESPIRATORY_TRACT | Status: DC

## 2015-02-02 MED ORDER — ONDANSETRON HCL 4 MG PO TABS: 4.0000 mg | ORAL_TABLET | Freq: Four times a day (QID) | ORAL | Status: DC | PRN

## 2015-02-02 MED ORDER — TRAMADOL HCL 50 MG PO TABS: 50.0000 mg | ORAL_TABLET | Freq: Three times a day (TID) | ORAL | Status: DC | PRN

## 2015-02-02 MED ORDER — SODIUM CHLORIDE 0.9 % IJ SOLN
3.0000 mL | Freq: Two times a day (BID) | INTRAMUSCULAR | Status: DC
  Administered 2015-02-02: 3 mL via INTRAVENOUS

## 2015-02-02 NOTE — H&P (Signed)
[facility] History and Physical  FELICE DEEM ZOX:[id number] DOB: 1933/09/24 DOA: 02/01/2015  Referring physician:  med center,  [place] ED PCP: [name], MD   Chief Complaint: shortness of breath  HPI:   Mr. Joe Gonzalez is a 82 year old male with a past medical history of COPD not requiring oxygen and HTN; who presented with acute onset of shortness of breath.  He states he had a normal day and had gone to church, and denies having any issues breathing until later on in the afternoon. He states symptoms occurred all of the sudden out of nowhere.   Tried using his home inhalers and nebulizers without relief of symptoms.  As a last ditch effort he reports trying to smoke a cigarette to see if it would open him up. Denies having any fever, chills, chest pain, abdominal pain, nausea, or vomiting. Patient notes that he continues to smoke , but states that he is down to less than half a pack of cigarettes per day on average.   Upon arrival to the emergency department the patient was placed on BiPAP  And given a continuous albuterol neb with improvement of wheezing. Patient was able to be transitioned from BiPAP after approximately 1 hour  tonasal cannula. Prior to transport from [facility]  thepatient was given 125 mg of Solu-Medrol and 2 grams of magnesium sulfate.      Review of Systems: negative for the following  Constitutional: Denies fever, chills, diaphoresis, appetite change and fatigue.  HEENT: Denies photophobia, eye pain, redness, hearing loss, ear pain, congestion, sore throat, rhinorrhea, sneezing, mouth sores, trouble swallowing, neck pain, neck stiffness and tinnitus.  Respiratory: Denies  hemoptysis Cardiovascular: Denies chest pain, palpitations and leg swelling.  Gastrointestinal: Denies nausea, vomiting, abdominal pain, diarrhea, constipation, blood in stool and abdominal distention.  Genitourinary: Denies dysuria, urgency, frequency, hematuria, flank  pain and difficulty urinating.  Musculoskeletal: Denies myalgias, back pain, joint swelling, arthralgias and gait problem.  Skin: Denies pallor, rash and wound.  Neurological: Denies dizziness, seizures, syncope, weakness, light-headedness, numbness and headaches.  Hematological: Denies adenopathy. Easy bruising, personal or family bleeding history  Psychiatric/Behavioral: Denies suicidal ideation, mood changes, confusion, nervousness, sleep disturbance and agitation       Past Medical History  Diagnosis Date  . COPD (chronic obstructive pulmonary disease) (HCC)   . Hypertension      Past Surgical History  Procedure Laterality Date  . Hernia repair    . Back surgery    . Rotator cuff repair    . Knee arthroplasty        Social History:  reports that he has been smoking Cigarettes.  He has a 10 pack-year smoking history. He has never used smokeless tobacco. He reports that he does not drink alcohol or use illicit drugs. Where does patient live--home  and with whom if at home? Family Can patient participate in ADLs? Yes  No Known Allergies  Family History  Problem Relation Age of Onset  . Asthma Mother        Prior to Admission medications   Medication Sig Start Date End Date Taking? Authorizing Provider  albuterol (PROAIR HFA) 108 (90 BASE) MCG/ACT inhaler Take 1 puff by mouth every 6 (six) hours as needed for wheezing.  02/07/13   Historical Provider, MD  amLODipine (NORVASC) 5 MG tablet Take 5 mg by mouth daily.    Historical Provider, MD  aspirin EC 81 MG EC tablet Take 1 tablet (81 mg total) by mouth daily.  10/26/14   Lonia Blood, MD  budesonide-formoterol [facility]) 160-4.5 MCG/ACT inhaler Take 2 puffs first thing in am and then another 2 puffs about 12 hours later. Patient taking differently: Inhale 2 puffs into the lungs every 12 (twelve) hours. Take 2 puffs first thing in am and then another 2 puffs about 12 hours later. 08/14/14   [name], MD   doxazosin (CARDURA) 2 MG tablet Take 2 mg by mouth daily.    Historical Provider, MD  furosemide (LASIX) 20 MG tablet Take 1 tablet by mouth daily as needed. 12/23/13   Historical Provider, MD  ipratropium-albuterol (DUONEB) 0.5-2.5 (3) MG/3ML SOLN Take 3 mLs by nebulization every 6 (six) hours as needed (wheezing).    Historical Provider, MD  potassium chloride SA (K-DUR,KLOR-CON) 20 MEQ tablet Take 1 tablet by mouth daily as needed. With lasix 12/23/13   Historical Provider, MD  predniSONE (DELTASONE) 10 MG tablet Take  4 each am x 2 days,   2 each am x 2 days,  1 each am x 2 days and stop 12/29/14   [name], MD  rOPINIRole (REQUIP) 2 MG tablet Take 1 mg by mouth 2 (two) times daily. Takes  tablet daily at dinnertime and  tablet every night at bedtime.    Historical Provider, MD  Tiotropium Bromide Monohydrate (SPIRIVA RESPIMAT) 2.5 MCG/ACT AERS 2 pffs each am 12/01/14   [name], MD  traMADol (ULTRAM) 50 MG tablet Take 50-100 mg by mouth 3 (three) times daily as needed for moderate pain.     Historical Provider, MD     Physical Exam: Filed Vitals:   02/02/15 0300 02/02/15 0330 02/02/15 0345 02/02/15 0519  BP: 162/76 147/83    Pulse: 109 99 92   Temp:      TempSrc:      Resp: Weight:      SpO2: 97% 97% 97% 99%     Constitutional: Vital signs reviewed. Patient is a well-developed and well-nourished elderly male in no acute distress and cooperative with exam. Alert and oriented x3.  Head: Normocephalic and atraumatic  Ear: TM normal bilaterally  Mouth: no erythema or exudates, MMM  Eyes: PERRL, EOMI, conjunctivae normal, No scleral icterus.  Neck: Supple, Trachea midline normal ROM, No JVD, mass, thyromegaly, or carotid bruit present.  Cardiovascular: RRR, S1 normal, S2 normal, no MRG, pulses symmetric and intact bilaterally  Pulmonary/Chest:  Diffuse bilateral wheezes with  Improved aeration patient  not using accessory muscles to breathe Abdominal: Soft.  Non-tender, non-distended, bowel sounds are normal, no masses, organomegaly, or guarding present.  GU: no CVA tenderness Musculoskeletal: No joint deformities, erythema, or stiffness, ROM full and no nontender Ext: no edema and no cyanosis, pulses palpable bilaterally (DP and PT)  Hematology: no cervical, inginal, or axillary adenopathy.  Neurological: A&O x3, Strenght is normal and symmetric bilaterally, cranial nerve II-XII are grossly intact, no focal motor deficit, sensory intact to light touch bilaterally.  Skin: Warm, dry and intact. No rash, cyanosis, or clubbing.  Psychiatric: Normal mood and affect. speech and behavior is normal. Judgment and thought content normal. Cognition and memory are normal.      Data Review   Micro Results No results found for this or any previous visit (from the past 240 hour(s)).  Radiology Reports Dg Chest Portable 1 View  02/02/2015  CLINICAL DATA:  Worsening shortness of breath. Sudden onset 5 hours ago. Chest tightness. Bilateral lower extremity swelling. Wheezing. EXAM: PORTABLE  CHEST 1 VIEW COMPARISON:  10/24/2014 FINDINGS: Normal heart size and pulmonary vascularity. Emphysematous changes in the lungs. Linear fibrosis or atelectasis in the mid lungs and lung bases. No focal airspace disease or consolidation. No blunting of costophrenic angles. No pneumothorax. Calcified and tortuous aorta. Postoperative changes in both shoulders. IMPRESSION: Emphysematous changes and scattered fibrosis in the lungs. No evidence of active pulmonary disease. Electronically Signed   By: [name] M.D.   On: 02/02/2015 00:18     CBC  Recent Labs Lab 02/01/15 2320  WBC 10.3  HGB 14.3  HCT 43.8  PLT 177  MCV 97.8  MCH 31.9  MCHC 32.6  RDW 13.6  LYMPHSABS 1.6  MONOABS 1.3*  EOSABS 0.1  BASOSABS 0.1    Chemistries   Recent Labs Lab 02/01/15 2320  NA 144  K 3.9  CL 110  CO2 27  GLUCOSE 119*  BUN 31*  CREATININE 1.10  CALCIUM 9.2    ------------------------------------------------------------------------------------------------------------------ estimated creatinine clearance is 50.9 mL/min (by C-G formula based on Cr of 1.1). ------------------------------------------------------------------------------------------------------------------ No results for input(s): HGBA1C in the last 72 hours. ------------------------------------------------------------------------------------------------------------------ No results for input(s): CHOL, HDL, LDLCALC, TRIG, CHOLHDL, LDLDIRECT in the last 72 hours. ------------------------------------------------------------------------------------------------------------------ No results for input(s): TSH, T4TOTAL, T3FREE, THYROIDAB in the last 72 hours.  Invalid input(s): FREET3 ------------------------------------------------------------------------------------------------------------------ No results for input(s): VITAMINB12, FOLATE, FERRITIN, TIBC, IRON, RETICCTPCT in the last 72 hours.  Coagulation profile No results for input(s): INR, PROTIME in the last 168 hours.  No results for input(s): DDIMER in the last 72 hours.  Cardiac Enzymes  Recent Labs Lab 02/01/15 2320  TROPONINI <0.03   ------------------------------------------------------------------------------------------------------------------ Invalid input(s): POCBNP   CBG: No results for input(s): GLUCAP in the last 168 hours.     EKG: Independently reviewed.  Sinus tachycardia with left bundle branch block   Assessment/Plan Principal Problem: COPD exacerbation (HCC):  Acute.  Patient with a history of continued smoking.  Able to come off BiPAP to nasal cannula oxygen 3 L. Given Continuous duoneb, 125 mg of Solu-Medrol and 2g  magnesium sulfate in the ED. - Admit to telemetry - Continuous pulse oximetry with nasal cannula oxygen to keep O2 sats greater than 92% - Albuterol neb q4hrs and prn q2 hrs -  Budesonide & Brovana q12 - prednisone  qdaily  Cigarette smoker:  Patient smokes half pack cigarettes per day or less. Patient has no will to quit at this time. Offered nicotine patch, but patient declined - smoking cessation counseling  Essential hypertension, benign -  Continue amlodipine, cardura, Lasix, potassium cholride   Code Status:   full Family Communication: bedside Disposition Plan: admit   Total time spent 55 minutes.Greater than 50% of this time was spent in counseling, explanation of diagnosis, planning of further management, and coordination of care  Clydie Braun [facility] Pager [phone]  If 7PM-7AM, please contact night-coverage [email or web address] Password [email or web address] 02/02/2015, 5:22 AM

## 2015-02-02 NOTE — Progress Notes (Signed)
Removed patient from BIPAP.  Patient states that he is breathing better.

## 2015-02-02 NOTE — ED Notes (Addendum)
[facility] here for transport, no changes, pt resting/ sleeping, NAD, calm, wheezing and increased wob remain.

## 2015-02-02 NOTE — Progress Notes (Signed)
Placed patient on 2 liter nasal cannula to maintain a SPO2 > 92%.  Patient's SPO2 currently 96%.

## 2015-02-02 NOTE — ED Notes (Addendum)
RT & MD at [facility], pt with SPO2 drop when sleeping 88% RA, placed on O2 [place] 2L, pt to be admitted. Family at [facility] x3. VSS.

## 2015-02-02 NOTE — Progress Notes (Signed)
Ambulated pt in hallway on RA. O2 sats remained above 93% and pt tolerated well.

## 2015-02-02 NOTE — Discharge Instructions (Signed)
Follow with Primary MD [name], MD in 7 days   Get CBC, CMP, 2 view Chest X ray checked  by Primary MD next visit.    Activity: As tolerated with Full fall precautions use walker/cane & assistance as needed   Disposition Home    Diet: Heart Healthy  , with feeding assistance and aspiration precautions.  For Heart failure patients - Check your Weight same time everyday, if you gain over 2 pounds, or you develop in leg swelling, experience more shortness of breath or chest pain, call your Primary MD immediately. Follow Cardiac Low Salt Diet and 1.5 lit/day fluid restriction.   On your next visit with your primary care physician please Get Medicines reviewed and adjusted.   Please request your Prim.MD to go over all Hospital Tests and Procedure/Radiological results at the follow up, please get all Hospital records sent to your Prim MD by signing hospital release before you go home.   If you experience worsening of your admission symptoms, develop shortness of breath, life threatening emergency, suicidal or homicidal thoughts you must seek medical attention immediately by calling 911 or calling your MD immediately  if symptoms less severe.  You Must read complete instructions/literature along with all the possible adverse reactions/side effects for all the Medicines you take and that have been prescribed to you. Take any new Medicines after you have completely understood and accpet all the possible adverse reactions/side effects.   Do not drive, operating heavy machinery, perform activities at heights, swimming or participation in water activities or provide baby sitting services if your were admitted for syncope or siezures until you have seen by Primary MD or a Neurologist and advised to do so again.  Do not drive when taking Pain medications.    Do not take more than prescribed Pain, Sleep and Anxiety Medications  Special Instructions: If you have smoked or chewed Tobacco   in the last 2 yrs please stop smoking, stop any regular Alcohol  and or any Recreational drug use.  Wear Seat belts while driving.   Please note  You were cared for by a hospitalist during your hospital stay. If you have any questions about your discharge medications or the care you received while you were in the hospital after you are discharged, you can call the unit and asked to speak with the hospitalist on call if the hospitalist that took care of you is not available. Once you are discharged, your primary care physician will handle any further medical issues. Please note that NO REFILLS for any discharge medications will be authorized once you are discharged, as it is imperative that you return to your primary care physician (or establish a relationship with a primary care physician if you do not have one) for your aftercare needs so that they can reassess your need for medications and monitor your lab values.

## 2015-02-02 NOTE — ED Notes (Signed)
Neb in progress via bipap, xray at [facility], xray complete

## 2015-02-02 NOTE — ED Notes (Signed)
Tolerating being off bipap, states, "feel better, at baseline", faint expiratory wheeze remains (improved), resting comfortably. Family at [facility]. VSS.

## 2015-02-02 NOTE — ED Notes (Signed)
Calmer, breathing easier, "feel better", expiritory wheeze remains, tolerating Bipap, mag complete. family at [facility]. VSS. Pending cxr results.

## 2015-02-02 NOTE — Discharge Summary (Signed)
Joe Gonzalez, is a 82 y.o. male  DOB Dec 25, 1933  MRN [id number].  Admission date:  02/01/2015  Admitting Physician  Clydie Braun, MD  Discharge Date:  02/02/2015   Primary MD  [name], MD  Recommendations for primary care physician for things to follow:  - Please check CBC, BMP, 2 view chest x-ray during next visit.   Admission Diagnosis  COPD with exacerbation (HCC) [J44.1]   Discharge Diagnosis  COPD with exacerbation (HCC) [J44.1]   Principal Problem:   COPD exacerbation (HCC) Active Problems:   Essential hypertension, benign   Cigarette smoker      Past Medical History  Diagnosis Date  . COPD (chronic obstructive pulmonary disease) (HCC)   . Hypertension     Past Surgical History  Procedure Laterality Date  . Hernia repair    . Back surgery    . Rotator cuff repair    . Knee arthroplasty         History of present illness and  Hospital Course:     Kindly see H&P for history of present illness and admission details, please review complete Labs, Consult reports and Test reports for all details in brief  HPI  from the history and physical done on the day of admission 02/02/2015 HPI: Joe Gonzalez is a 82 year old male with a past medical history of COPD not requiring oxygen and HTN; who presented with acute onset of shortness of breath. He states he had a normal day and had gone to church, and denies having any issues breathing until later on in the afternoon. He states symptoms occurred all of the sudden out of nowhere. Tried using his home inhalers and nebulizers without relief of symptoms. As a last ditch effort he reports trying to smoke a cigarette to see if it would open him up. Denies having any fever, chills, chest pain, abdominal pain, nausea, or vomiting. Patient notes that he continues to smoke , but states that he is down to less than half a pack of cigarettes  per day on average.   Upon arrival to the emergency department the patient was placed on BiPAP And given a continuous albuterol neb with improvement of wheezing. Patient was able to be transitioned from BiPAP after approximately 1 hour tonasal cannula. [facility] thepatient was given 125 mg of Solu-Medrol and 2 grams of magnesium sulfate   Hospital Course  COPD exacerbation - Patient with known history of COPD in the past, still active smoker, admitted for COPD exacerbation, initially on BiPAP, came off BiPAP, transitioned to nasal cannula, received magnesium citrate and IV Solu-Medrol in ED, was on budesonide and brovana, patient was saturating 93-94% room air, patient request to go home today, he ambulated in the hallway with no significant dyspnea, saturating 93% on room air, afebrile, patient will be discharged on prednisone taper dose, Z-Pak ,to resume his COPD home medication, no oxygen requirement on discharge.  Tobacco abuse - He was counseled, not interested to quit.  Hypertension -  Continue home medication on discharge     Discharge Condition: stable   Follow UP  Follow-up Information    Follow up with ALEXANDER,ANTHONY, MD. Schedule an appointment as soon as possible for a visit in 1 week.   Specialty:  Internal Medicine   Why:  Posthospitalization follow-up   Contact information:   [address] [place] [address] [phone]         Discharge Instructions  and  Discharge Medications    Discharge Instructions    Diet - low sodium heart healthy    Complete by:  As directed      Discharge instructions    Complete by:  As directed   Follow with Primary MD [name], MD in 7 days   Get CBC, CMP, 2 view Chest X ray checked  by Primary MD next visit.    Activity: As tolerated with Full fall precautions use walker/cane & assistance as needed   Disposition Home **   Diet: Heart Healthy ** , with  feeding assistance and aspiration precautions.  For Heart failure patients - Check your Weight same time everyday, if you gain over 2 pounds, or you develop in leg swelling, experience more shortness of breath or chest pain, call your Primary MD immediately. Follow Cardiac Low Salt Diet and 1.5 lit/day fluid restriction.   On your next visit with your primary care physician please Get Medicines reviewed and adjusted.   Please request your Prim.MD to go over all Hospital Tests and Procedure/Radiological results at the follow up, please get all Hospital records sent to your Prim MD by signing hospital release before you go home.   If you experience worsening of your admission symptoms, develop shortness of breath, life threatening emergency, suicidal or homicidal thoughts you must seek medical attention immediately by calling 911 or calling your MD immediately  if symptoms less severe.  You Must read complete instructions/literature along with all the possible adverse reactions/side effects for all the Medicines you take and that have been prescribed to you. Take any new Medicines after you have completely understood and accpet all the possible adverse reactions/side effects.   Do not drive, operating heavy machinery, perform activities at heights, swimming or participation in water activities or provide baby sitting services if your were admitted for syncope or siezures until you have seen by Primary MD or a Neurologist and advised to do so again.  Do not drive when taking Pain medications.    Do not take more than prescribed Pain, Sleep and Anxiety Medications  Special Instructions: If you have smoked or chewed Tobacco  in the last 2 yrs please stop smoking, stop any regular Alcohol  and or any Recreational drug use.  Wear Seat belts while driving.   Please note  You were cared for by a hospitalist during your hospital stay. If you have any questions about your discharge medications or  the care you received while you were in the hospital after you are discharged, you can call the unit and asked to speak with the hospitalist on call if the hospitalist that took care of you is not available. Once you are discharged, your primary care physician will handle any further medical issues. Please note that NO REFILLS for any discharge medications will be authorized once you are discharged, as it is imperative that you return to your primary care physician (or establish a relationship with a primary care physician if you do not have one) for your aftercare needs so that they  can reassess your need for medications and monitor your lab values.     Increase activity slowly    Complete by:  As directed             Medication List    TAKE these medications        amLODipine 5 MG tablet  Commonly known as:  NORVASC  Take 5 mg by mouth daily.     aspirin 81 MG EC tablet  Take 1 tablet (81 mg total) by mouth daily.     azithromycin 250 MG tablet  Commonly known as:  ZITHROMAX  Use as instructed, 2 tablets oral first day, then 1 tablet oral daily thereafter     budesonide-formoterol 160-4.5 MCG/ACT inhaler  Commonly known as:  SYMBICORT  Take 2 puffs first thing in am and then another 2 puffs about 12 hours later.     furosemide 20 MG tablet  Commonly known as:  LASIX  Take 1 tablet by mouth daily as needed.     guaiFENesin 600 MG 12 hr tablet  Commonly known as:  MUCINEX  Take 1 tablet (600 mg total) by mouth 2 (two) times daily.     ipratropium-albuterol 0.5-2.5 (3) MG/3ML Soln  Commonly known as:  DUONEB  Take 3 mLs by nebulization every 6 (six) hours as needed (wheezing).     losartan 100 MG tablet  Commonly known as:  COZAAR  Take 100 mg by mouth daily.     potassium chloride SA 20 MEQ tablet  Commonly known as:  K-DUR,KLOR-CON  Take 1 tablet by mouth daily as needed. With lasix     predniSONE 10 MG tablet  Commonly known as:  DELTASONE  Take  4 each am x 2 days,   3 each am x 2 days,  2 each am x 2 days, 1 each am x 2 days  ,then  Stop.     PROAIR HFA 108 (90 Base) MCG/ACT inhaler  Generic drug:  albuterol  Take 1 puff by mouth every 6 (six) hours as needed for wheezing.     rOPINIRole 2 MG tablet  Commonly known as:  REQUIP  Take 1 mg by mouth 2 (two) times daily. Takes  tablet daily at dinnertime and  tablet every night at bedtime.     Tiotropium Bromide Monohydrate 2.5 MCG/ACT Aers  Commonly known as:  SPIRIVA RESPIMAT  2 pffs each am     traMADol 50 MG tablet  Commonly known as:  ULTRAM  Take 50-100 mg by mouth 3 (three) times daily as needed for moderate pain.          Diet and Activity recommendation: See Discharge Instructions above   Consults obtained -  none   Major procedures and Radiology Reports - PLEASE review detailed and final reports for all details, in brief -      Dg Chest Portable 1 View  02/02/2015  CLINICAL DATA:  Worsening shortness of breath. Sudden onset 5 hours ago. Chest tightness. Bilateral lower extremity swelling. Wheezing. EXAM: PORTABLE CHEST 1 VIEW COMPARISON:  10/24/2014 FINDINGS: Normal heart size and pulmonary vascularity. Emphysematous changes in the lungs. Linear fibrosis or atelectasis in the mid lungs and lung bases. No focal airspace disease or consolidation. No blunting of costophrenic angles. No pneumothorax. Calcified and tortuous aorta. Postoperative changes in both shoulders. IMPRESSION: Emphysematous changes and scattered fibrosis in the lungs. No evidence of active pulmonary disease. Electronically Signed   By: [name] M.D.   On: 02/02/2015  00:18    Micro Results    Recent Results (from the past 240 hour(s))  MRSA PCR Screening     Status: None   Collection Time: 02/02/15  4:59 AM  Result Value Ref Range Status   MRSA by PCR NEGATIVE NEGATIVE Final    Comment:        The GeneXpert MRSA Assay (FDA approved for NASAL specimens only), is one component of a comprehensive  MRSA colonization surveillance program. It is not intended to diagnose MRSA infection nor to guide or monitor treatment for MRSA infections.        Today   Subjective:   Joe Gonzalez today has no headache,no chest or abdominal pain,no new weakness tingling or numbness, reports dyspnea at baseline, reports occasional cough with productive sputum, feels much better wants to go home today.  Objective:   Blood pressure 134/59, pulse 84, temperature 97.7 F (36.5 C), temperature source Oral, resp. rate 18, weight 66.679 kg (147 lb), SpO2 94 %.   Intake/Output Summary (Last 24 hours) at 02/02/15 1436 Last data filed at 02/02/15 0900  Gross per 24 hour  Intake    240 ml  Output      0 ml  Net    240 ml    Exam Awake Alert, Oriented x 3, No new F.N deficits, Normal affect [place].AT,PERRAL Supple Neck,No JVD,  Symmetrical Chest wall movement, Good air movement bilaterally, mild end  expiratory wheezing, no use of accessory muscle RRR,No Gallops,Rubs or new Murmurs, No Parasternal Heave +ve B.Sounds, Abd Soft, Non tender, No organomegaly appriciated, No rebound -guarding or rigidity. No Cyanosis, Clubbing or edema, No new Rash or bruise  Data Review   CBC w Diff: Lab Results  Component Value Date   WBC 10.3 02/01/2015   HGB 14.3 02/01/2015   HCT 43.8 02/01/2015   PLT 177 02/01/2015   LYMPHOPCT 16 02/01/2015   BANDSPCT 0 10/24/2014   MONOPCT 12 02/01/2015   EOSPCT 1 02/01/2015   BASOPCT 1 02/01/2015    CMP: Lab Results  Component Value Date   NA 144 02/01/2015   K 3.9 02/01/2015   CL 110 02/01/2015   CO2 27 02/01/2015   BUN 31* 02/01/2015   CREATININE 1.10 02/01/2015   PROT 7.2 10/24/2014   ALBUMIN 4.3 10/24/2014   BILITOT 0.9 10/24/2014   ALKPHOS 66 10/24/2014   AST 29 10/24/2014   ALT 26 10/24/2014  .   Total Time in preparing paper work, data evaluation and todays exam - 35 minutes  Amyah Clawson M.D on 02/02/2015 at 2:36 PM  [facility]     Office  [phone]

## 2015-02-02 NOTE — Progress Notes (Signed)
Except the patient from [facility] [employer]. Patient is a 82 year old with past medical history significant for COPD who presented to the ED with acute shortness of breath. Placed initially on BiPAP initial ABG pH 7.303, PCO2 55.1, PO2 55. After BiPAP PCO2 down to 49. Now on nasal cannula vitals otherwise stable. No signs of pneumonia on chest x-ray and lab work otherwise unremarkable. Admitting patient to a telemetry observation bed here at [facility] for COPD exacerbation.

## 2015-02-14 ENCOUNTER — Emergency Department (HOSPITAL_BASED_OUTPATIENT_CLINIC_OR_DEPARTMENT_OTHER): Payer: Medicare Other

## 2015-02-14 ENCOUNTER — Encounter (HOSPITAL_BASED_OUTPATIENT_CLINIC_OR_DEPARTMENT_OTHER): Payer: Self-pay | Admitting: *Deleted

## 2015-02-14 ENCOUNTER — Inpatient Hospital Stay (HOSPITAL_BASED_OUTPATIENT_CLINIC_OR_DEPARTMENT_OTHER)
Admission: EM | Admit: 2015-02-14 | Discharge: 2015-03-11 | DRG: 207 | Disposition: E | Payer: Medicare Other | Attending: Pulmonary Disease | Admitting: Pulmonary Disease

## 2015-02-14 DIAGNOSIS — R918 Other nonspecific abnormal finding of lung field: Secondary | ICD-10-CM | POA: Diagnosis present

## 2015-02-14 DIAGNOSIS — R339 Retention of urine, unspecified: Secondary | ICD-10-CM

## 2015-02-14 DIAGNOSIS — G2581 Restless legs syndrome: Secondary | ICD-10-CM | POA: Diagnosis present

## 2015-02-14 DIAGNOSIS — Y95 Nosocomial condition: Secondary | ICD-10-CM | POA: Diagnosis present

## 2015-02-14 DIAGNOSIS — J9601 Acute respiratory failure with hypoxia: Secondary | ICD-10-CM

## 2015-02-14 DIAGNOSIS — J440 Chronic obstructive pulmonary disease with acute lower respiratory infection: Secondary | ICD-10-CM | POA: Diagnosis not present

## 2015-02-14 DIAGNOSIS — Z452 Encounter for adjustment and management of vascular access device: Secondary | ICD-10-CM

## 2015-02-14 DIAGNOSIS — Z825 Family history of asthma and other chronic lower respiratory diseases: Secondary | ICD-10-CM

## 2015-02-14 DIAGNOSIS — R0602 Shortness of breath: Secondary | ICD-10-CM

## 2015-02-14 DIAGNOSIS — R600 Localized edema: Secondary | ICD-10-CM | POA: Diagnosis present

## 2015-02-14 DIAGNOSIS — R609 Edema, unspecified: Secondary | ICD-10-CM

## 2015-02-14 DIAGNOSIS — Z7952 Long term (current) use of systemic steroids: Secondary | ICD-10-CM

## 2015-02-14 DIAGNOSIS — E861 Hypovolemia: Secondary | ICD-10-CM | POA: Diagnosis not present

## 2015-02-14 DIAGNOSIS — E872 Acidosis: Secondary | ICD-10-CM | POA: Diagnosis not present

## 2015-02-14 DIAGNOSIS — J189 Pneumonia, unspecified organism: Secondary | ICD-10-CM | POA: Diagnosis present

## 2015-02-14 DIAGNOSIS — I10 Essential (primary) hypertension: Secondary | ICD-10-CM | POA: Diagnosis present

## 2015-02-14 DIAGNOSIS — J45909 Unspecified asthma, uncomplicated: Secondary | ICD-10-CM | POA: Diagnosis present

## 2015-02-14 DIAGNOSIS — J9621 Acute and chronic respiratory failure with hypoxia: Secondary | ICD-10-CM | POA: Diagnosis present

## 2015-02-14 DIAGNOSIS — N281 Cyst of kidney, acquired: Secondary | ICD-10-CM | POA: Diagnosis present

## 2015-02-14 DIAGNOSIS — M79606 Pain in leg, unspecified: Secondary | ICD-10-CM

## 2015-02-14 DIAGNOSIS — Z7982 Long term (current) use of aspirin: Secondary | ICD-10-CM

## 2015-02-14 DIAGNOSIS — Z7951 Long term (current) use of inhaled steroids: Secondary | ICD-10-CM

## 2015-02-14 DIAGNOSIS — J449 Chronic obstructive pulmonary disease, unspecified: Secondary | ICD-10-CM | POA: Diagnosis present

## 2015-02-14 DIAGNOSIS — J441 Chronic obstructive pulmonary disease with (acute) exacerbation: Secondary | ICD-10-CM | POA: Diagnosis not present

## 2015-02-14 DIAGNOSIS — I959 Hypotension, unspecified: Secondary | ICD-10-CM | POA: Diagnosis not present

## 2015-02-14 DIAGNOSIS — E875 Hyperkalemia: Secondary | ICD-10-CM | POA: Diagnosis not present

## 2015-02-14 DIAGNOSIS — I447 Left bundle-branch block, unspecified: Secondary | ICD-10-CM | POA: Diagnosis present

## 2015-02-14 DIAGNOSIS — Z4659 Encounter for fitting and adjustment of other gastrointestinal appliance and device: Secondary | ICD-10-CM

## 2015-02-14 DIAGNOSIS — Z978 Presence of other specified devices: Secondary | ICD-10-CM

## 2015-02-14 DIAGNOSIS — N179 Acute kidney failure, unspecified: Secondary | ICD-10-CM | POA: Diagnosis not present

## 2015-02-14 DIAGNOSIS — R001 Bradycardia, unspecified: Secondary | ICD-10-CM | POA: Diagnosis not present

## 2015-02-14 DIAGNOSIS — I129 Hypertensive chronic kidney disease with stage 1 through stage 4 chronic kidney disease, or unspecified chronic kidney disease: Secondary | ICD-10-CM | POA: Diagnosis present

## 2015-02-14 DIAGNOSIS — F17210 Nicotine dependence, cigarettes, uncomplicated: Secondary | ICD-10-CM | POA: Diagnosis present

## 2015-02-14 DIAGNOSIS — R0600 Dyspnea, unspecified: Secondary | ICD-10-CM

## 2015-02-14 DIAGNOSIS — N183 Chronic kidney disease, stage 3 unspecified: Secondary | ICD-10-CM

## 2015-02-14 DIAGNOSIS — K922 Gastrointestinal hemorrhage, unspecified: Secondary | ICD-10-CM | POA: Diagnosis not present

## 2015-02-14 DIAGNOSIS — Z01818 Encounter for other preprocedural examination: Secondary | ICD-10-CM

## 2015-02-14 DIAGNOSIS — I472 Ventricular tachycardia: Secondary | ICD-10-CM | POA: Diagnosis not present

## 2015-02-14 DIAGNOSIS — Z79899 Other long term (current) drug therapy: Secondary | ICD-10-CM

## 2015-02-14 HISTORY — DX: Unspecified asthma, uncomplicated: J45.909

## 2015-02-14 HISTORY — DX: Restless legs syndrome: G25.81

## 2015-02-14 LAB — CBC WITH DIFFERENTIAL/PLATELET
BAND NEUTROPHILS: 7 %
BASOS ABS: 0 10*3/uL (ref 0.0–0.1)
BLASTS: 0 %
Basophils Relative: 0 %
Eosinophils Absolute: 0 10*3/uL (ref 0.0–0.7)
Eosinophils Relative: 0 %
HEMATOCRIT: 44.2 % (ref 39.0–52.0)
HEMOGLOBIN: 14.4 g/dL (ref 13.0–17.0)
Lymphocytes Relative: 6 %
Lymphs Abs: 0.4 10*3/uL — ABNORMAL LOW (ref 0.7–4.0)
MCH: 31.6 pg (ref 26.0–34.0)
MCHC: 32.6 g/dL (ref 30.0–36.0)
MCV: 96.9 fL (ref 78.0–100.0)
METAMYELOCYTES PCT: 0 %
MONOS PCT: 5 %
MYELOCYTES: 0 %
Monocytes Absolute: 0.4 10*3/uL (ref 0.1–1.0)
Neutro Abs: 6.5 10*3/uL (ref 1.7–7.7)
Neutrophils Relative %: 82 %
PLATELETS: 150 10*3/uL (ref 150–400)
PROMYELOCYTES ABS: 0 %
RBC: 4.56 MIL/uL (ref 4.22–5.81)
RDW: 14.2 % (ref 11.5–15.5)
WBC: 7.3 10*3/uL (ref 4.0–10.5)
nRBC: 0 /100 WBC

## 2015-02-14 LAB — BASIC METABOLIC PANEL
ANION GAP: 8 (ref 5–15)
BUN: 29 mg/dL — AB (ref 6–20)
CHLORIDE: 109 mmol/L (ref 101–111)
CO2: 25 mmol/L (ref 22–32)
Calcium: 8.6 mg/dL — ABNORMAL LOW (ref 8.9–10.3)
Creatinine, Ser: 1.09 mg/dL (ref 0.61–1.24)
GFR calc Af Amer: >60 mL/min (ref >60)
GLUCOSE: 122 mg/dL — AB (ref 65–99)
POTASSIUM: 4.2 mmol/L (ref 3.5–5.1)
Sodium: 142 mmol/L (ref 135–145)

## 2015-02-14 LAB — CBC
HCT: 40.9 % (ref 39.0–52.0)
Hemoglobin: 13.9 g/dL (ref 13.0–17.0)
MCH: 33.2 pg (ref 26.0–34.0)
MCHC: 34 g/dL (ref 30.0–36.0)
MCV: 97.6 fL (ref 78.0–100.0)
Platelets: 156 10*3/uL (ref 150–400)
RBC: 4.19 MIL/uL — ABNORMAL LOW (ref 4.22–5.81)
RDW: 14.3 % (ref 11.5–15.5)
WBC: 7.2 10*3/uL (ref 4.0–10.5)

## 2015-02-14 LAB — CREATININE, SERUM
Creatinine, Ser: 1.49 mg/dL — ABNORMAL HIGH (ref 0.61–1.24)
GFR calc Af Amer: 50 mL/min — ABNORMAL LOW (ref >60)
GFR calc non Af Amer: 43 mL/min — ABNORMAL LOW (ref >60)

## 2015-02-14 LAB — TROPONIN I: Troponin I: <0.03 ng/mL (ref <0.031)

## 2015-02-14 MED ORDER — ASPIRIN EC 81 MG PO TBEC
81.0000 mg | DELAYED_RELEASE_TABLET | Freq: Every day | ORAL | Status: DC
  Administered 2015-02-15 – 2015-02-16 (×2): 81 mg via ORAL
  Filled 2015-02-14 (×4): qty 1

## 2015-02-14 MED ORDER — POLYVINYL ALCOHOL 1.4 % OP SOLN
1.0000 [drp] | OPHTHALMIC | Status: DC | PRN
  Filled 2015-02-14: qty 15

## 2015-02-14 MED ORDER — CARBOXYMETHYLCELLULOSE SODIUM 1 % OP SOLN: 1.0000 [drp] | Freq: Two times a day (BID) | OPHTHALMIC | Status: DC | PRN

## 2015-02-14 MED ORDER — LOSARTAN POTASSIUM 50 MG PO TABS
100.0000 mg | ORAL_TABLET | Freq: Every day | ORAL | Status: DC
  Administered 2015-02-14 – 2015-02-15 (×2): 100 mg via ORAL
  Filled 2015-02-14 (×3): qty 2

## 2015-02-14 MED ORDER — LEVOFLOXACIN IN D5W 750 MG/150ML IV SOLN
750.0000 mg | INTRAVENOUS | Status: DC
  Administered 2015-02-14 – 2015-02-15 (×2): 750 mg via INTRAVENOUS
  Filled 2015-02-14 (×3): qty 150

## 2015-02-14 MED ORDER — METHYLPREDNISOLONE SODIUM SUCC 125 MG IJ SOLR
125.0000 mg | Freq: Once | INTRAMUSCULAR | Status: AC
  Administered 2015-02-14: 125 mg via INTRAVENOUS
  Filled 2015-02-14: qty 2

## 2015-02-14 MED ORDER — KETOROLAC TROMETHAMINE 15 MG/ML IJ SOLN
15.0000 mg | Freq: Once | INTRAMUSCULAR | Status: AC
  Administered 2015-02-14: 15 mg via INTRAVENOUS
  Filled 2015-02-14: qty 1

## 2015-02-14 MED ORDER — CLORAZEPATE DIPOTASSIUM 3.75 MG PO TABS
3.7500 mg | ORAL_TABLET | Freq: Two times a day (BID) | ORAL | Status: DC | PRN
  Administered 2015-02-14: 3.75 mg via ORAL
  Filled 2015-02-14: qty 1

## 2015-02-14 MED ORDER — TRAMADOL HCL 50 MG PO TABS: 50.0000 mg | ORAL_TABLET | Freq: Three times a day (TID) | ORAL | Status: DC | PRN

## 2015-02-14 MED ORDER — ALBUTEROL (5 MG/ML) CONTINUOUS INHALATION SOLN
10.0000 mg/h | INHALATION_SOLUTION | Freq: Once | RESPIRATORY_TRACT | Status: AC
  Administered 2015-02-14: 10 mg/h via RESPIRATORY_TRACT
  Filled 2015-02-14: qty 20

## 2015-02-14 MED ORDER — IPRATROPIUM-ALBUTEROL 0.5-2.5 (3) MG/3ML IN SOLN
3.0000 mL | Freq: Once | RESPIRATORY_TRACT | Status: AC
  Administered 2015-02-14: 3 mL via RESPIRATORY_TRACT
  Filled 2015-02-14: qty 3

## 2015-02-14 MED ORDER — METHYLPREDNISOLONE SODIUM SUCC 125 MG IJ SOLR
80.0000 mg | Freq: Four times a day (QID) | INTRAMUSCULAR | Status: DC
  Administered 2015-02-14 – 2015-02-18 (×15): 80 mg via INTRAVENOUS
  Filled 2015-02-14: qty 2
  Filled 2015-02-14 (×2): qty 1.28
  Filled 2015-02-14 (×3): qty 2
  Filled 2015-02-14: qty 1.28
  Filled 2015-02-14: qty 2
  Filled 2015-02-14 (×2): qty 1.28
  Filled 2015-02-14 (×6): qty 2
  Filled 2015-02-14 (×3): qty 1.28

## 2015-02-14 MED ORDER — ONDANSETRON HCL 4 MG/2ML IJ SOLN: 4.0000 mg | Freq: Four times a day (QID) | INTRAMUSCULAR | Status: DC | PRN

## 2015-02-14 MED ORDER — ALBUTEROL SULFATE (2.5 MG/3ML) 0.083% IN NEBU
2.5000 mg | INHALATION_SOLUTION | Freq: Once | RESPIRATORY_TRACT | Status: AC
  Administered 2015-02-14: 2.5 mg via RESPIRATORY_TRACT
  Filled 2015-02-14: qty 3

## 2015-02-14 MED ORDER — MAGNESIUM SULFATE 2 GM/50ML IV SOLN
2.0000 g | Freq: Once | INTRAVENOUS | Status: AC
  Administered 2015-02-14: 2 g via INTRAVENOUS
  Filled 2015-02-14: qty 50

## 2015-02-14 MED ORDER — KETOCONAZOLE 2 % EX CREA: 1.0000 "application " | TOPICAL_CREAM | Freq: Every day | CUTANEOUS | Status: DC | PRN

## 2015-02-14 MED ORDER — ONDANSETRON HCL 4 MG PO TABS: 4.0000 mg | ORAL_TABLET | Freq: Four times a day (QID) | ORAL | Status: DC | PRN

## 2015-02-14 MED ORDER — SODIUM CHLORIDE 0.9 % IV SOLN
INTRAVENOUS | Status: AC
  Administered 2015-02-15: 15:00:00 via INTRAVENOUS

## 2015-02-14 MED ORDER — ENOXAPARIN SODIUM 40 MG/0.4ML ~~LOC~~ SOLN
40.0000 mg | SUBCUTANEOUS | Status: DC
  Administered 2015-02-14 – 2015-02-15 (×2): 40 mg via SUBCUTANEOUS
  Filled 2015-02-14 (×3): qty 0.4

## 2015-02-14 MED ORDER — SODIUM CHLORIDE 0.9% FLUSH
3.0000 mL | Freq: Two times a day (BID) | INTRAVENOUS | Status: DC
  Administered 2015-02-14 – 2015-02-20 (×9): 3 mL via INTRAVENOUS

## 2015-02-14 MED ORDER — OXYCODONE-ACETAMINOPHEN 5-325 MG PO TABS: 1.0000 | ORAL_TABLET | Freq: Once | ORAL | Status: DC

## 2015-02-14 MED ORDER — AZITHROMYCIN 250 MG PO TABS
500.0000 mg | ORAL_TABLET | Freq: Once | ORAL | Status: AC
Start: 2015-02-14 — End: 2015-02-14
  Administered 2015-02-14: 500 mg via ORAL
  Filled 2015-02-14: qty 2

## 2015-02-14 MED ORDER — GUAIFENESIN ER 600 MG PO TB12
600.0000 mg | ORAL_TABLET | Freq: Two times a day (BID) | ORAL | Status: DC
  Administered 2015-02-14 – 2015-02-16 (×4): 600 mg via ORAL
  Filled 2015-02-14 (×7): qty 1

## 2015-02-14 MED ORDER — PANTOPRAZOLE SODIUM 40 MG PO TBEC
40.0000 mg | DELAYED_RELEASE_TABLET | Freq: Every day | ORAL | Status: DC
  Administered 2015-02-14 – 2015-02-16 (×3): 40 mg via ORAL
  Filled 2015-02-14 (×4): qty 1

## 2015-02-14 MED ORDER — POTASSIUM CHLORIDE CRYS ER 20 MEQ PO TBCR
10.0000 meq | EXTENDED_RELEASE_TABLET | Freq: Every day | ORAL | Status: DC | PRN
Start: 2015-02-14 — End: 2015-02-18

## 2015-02-14 MED ORDER — ALBUTEROL SULFATE (2.5 MG/3ML) 0.083% IN NEBU
2.5000 mg | INHALATION_SOLUTION | RESPIRATORY_TRACT | Status: DC | PRN
  Administered 2015-02-15 – 2015-02-16 (×2): 2.5 mg via RESPIRATORY_TRACT
  Filled 2015-02-14 (×2): qty 3

## 2015-02-14 MED ORDER — IPRATROPIUM BROMIDE 0.02 % IN SOLN
0.5000 mg | Freq: Once | RESPIRATORY_TRACT | Status: AC
  Administered 2015-02-14: 0.5 mg via RESPIRATORY_TRACT
  Filled 2015-02-14: qty 2.5

## 2015-02-14 MED ORDER — FUROSEMIDE 20 MG PO TABS
20.0000 mg | ORAL_TABLET | Freq: Every day | ORAL | Status: DC | PRN
  Filled 2015-02-14: qty 1

## 2015-02-14 MED ORDER — AMLODIPINE BESYLATE 5 MG PO TABS
5.0000 mg | ORAL_TABLET | Freq: Every day | ORAL | Status: DC
  Administered 2015-02-15 – 2015-02-16 (×2): 5 mg via ORAL
  Filled 2015-02-14 (×4): qty 1

## 2015-02-14 MED ORDER — IPRATROPIUM-ALBUTEROL 0.5-2.5 (3) MG/3ML IN SOLN
3.0000 mL | Freq: Four times a day (QID) | RESPIRATORY_TRACT | Status: DC
  Administered 2015-02-14 – 2015-02-16 (×7): 3 mL via RESPIRATORY_TRACT
  Filled 2015-02-14 (×6): qty 3

## 2015-02-14 MED ORDER — ROPINIROLE HCL 1 MG PO TABS
1.0000 mg | ORAL_TABLET | Freq: Two times a day (BID) | ORAL | Status: DC
  Administered 2015-02-15 – 2015-02-16 (×4): 1 mg via ORAL
  Filled 2015-02-14 (×7): qty 1

## 2015-02-14 MED ORDER — DOXAZOSIN MESYLATE 2 MG PO TABS
2.0000 mg | ORAL_TABLET | Freq: Every day | ORAL | Status: DC
  Administered 2015-02-15 – 2015-02-16 (×2): 2 mg via ORAL
  Filled 2015-02-14 (×4): qty 1

## 2015-02-14 NOTE — ED Notes (Signed)
Speech appears wnl, color good

## 2015-02-14 NOTE — ED Notes (Signed)
12 lead ECG done, pt on cont cardiac monitoring with cont POX monitoring also

## 2015-02-14 NOTE — ED Notes (Addendum)
Pt on heart monitor when I entered Room

## 2015-02-14 NOTE — ED Provider Notes (Signed)
CSN: [id number]     Arrival date & time 02/20/2015  1116 History  By signing my name below, I, Sonum Patel, attest that this documentation has been prepared under the direction and in the presence of Glynn Octave, MD. Electronically Signed: Sonum Patel, Neurosurgeon. 02/28/2015. 11:33 AM.    Chief Complaint  Patient presents with  . Shortness of Breath   The history is provided by the patient. No language interpreter was used.     HPI Comments: Joe Gonzalez is a 82 y.o. male with past medical history of COPD, asthma who presents to the Emergency Department complaining of constant, gradually worsened SOB that began about 1 week ago with associated chest tightness and non-productive cough. He has had intermittent episodes of SOB over the last few weeks and has been seen in the ED twice due to this. He uses a nebulizer breathing treatment usually QD but states has used it 3-4 times each day for the last couple of days. He has been using an albuterol inhaler, Spiriva and taking prednisone at home without significant relief. He states this episode is milder than his last 2 visits. He denies fever, CP, abdominal pain, leg swelling. He denies a cardiac history.   Past Medical History  Diagnosis Date  . COPD (chronic obstructive pulmonary disease) (HCC)   . Hypertension   . Asthma    Past Surgical History  Procedure Laterality Date  . Hernia repair    . Back surgery    . Rotator cuff repair    . Knee arthroplasty     Family History  Problem Relation Age of Onset  . Asthma Mother    Social History  Substance Use Topics  . Smoking status: Current Every Day Smoker -- 0.25 packs/day for 40 years    Types: Cigarettes  . Smokeless tobacco: Never Used  . Alcohol Use: No    Review of Systems  A complete 10 system review of systems was obtained and all systems are negative except as noted in the HPI and PMH.    Allergies  Review of patient's allergies indicates no known allergies.  Home  Medications   Prior to Admission medications   Medication Sig Start Date End Date Taking? Authorizing Provider  albuterol (PROAIR HFA) 108 (90 BASE) MCG/ACT inhaler Take 1 puff by mouth every 6 (six) hours as needed for wheezing.  02/07/13  Yes Historical Provider, MD  amLODipine (NORVASC) 5 MG tablet Take 5 mg by mouth daily.   Yes Historical Provider, MD  aspirin EC 81 MG EC tablet Take 1 tablet (81 mg total) by mouth daily. 10/26/14  Yes Lonia Blood, MD  budesonide-formoterol [facility]) 160-4.5 MCG/ACT inhaler Take 2 puffs first thing in am and then another 2 puffs about 12 hours later. Patient taking differently: Inhale 2 puffs into the lungs every 12 (twelve) hours. Take 2 puffs first thing in am and then another 2 puffs about 12 hours later. 08/14/14  Yes [name], MD  clorazepate (TRANXENE) 3.75 MG tablet Take 3.75 mg by mouth daily as needed for anxiety.   Yes Historical Provider, MD  doxazosin (CARDURA) 2 MG tablet Take 2 mg by mouth daily.   Yes Historical Provider, MD  furosemide (LASIX) 20 MG tablet Take 1 tablet by mouth daily as needed for fluid.  12/23/13  Yes Historical Provider, MD  ipratropium-albuterol (DUONEB) 0.5-2.5 (3) MG/3ML SOLN Take 3 mLs by nebulization every 6 (six) hours as needed (wheezing).   Yes Historical Provider, MD  losartan (COZAAR) 100 MG tablet Take 100 mg by mouth daily.   Yes Historical Provider, MD  potassium chloride SA (K-DUR,KLOR-CON) 20 MEQ tablet Take 1 tablet by mouth daily as needed. With lasix 12/23/13  Yes Historical Provider, MD  predniSONE (DELTASONE) 10 MG tablet Take  4 each am x 2 days,  3 each am x 2 days,  2 each am x 2 days, 1 each am x 2 days  ,then  Stop. 02/02/15  Yes Leana Roe Elgergawy, MD  predniSONE (DELTASONE) 20 MG tablet Take 20 mg by mouth daily with breakfast.   Yes Historical Provider, MD  rOPINIRole (REQUIP) 2 MG tablet Take 1 mg by mouth 2 (two) times daily. Takes  tablet daily at dinnertime and  tablet every night  at bedtime.   Yes Historical Provider, MD  Tiotropium Bromide Monohydrate (SPIRIVA RESPIMAT) 2.5 MCG/ACT AERS 2 pffs each am 12/01/14  Yes [name], MD  traMADol (ULTRAM) 50 MG tablet Take 50-100 mg by mouth 3 (three) times daily as needed for moderate pain.    Yes Historical Provider, MD  azithromycin (ZITHROMAX) 250 MG tablet Use as instructed, 2 tablets oral first day, then 1 tablet oral daily thereafter 02/02/15   Starleen Arms, MD  guaiFENesin (MUCINEX) 600 MG 12 hr tablet Take 1 tablet (600 mg total) by mouth 2 (two) times daily. 02/02/15   Leana Roe Elgergawy, MD   BP 121/62 mmHg  Pulse 107  Temp(Src) 97.3 F (36.3 C) (Oral)  Resp 21  Ht  (1.702 m)  Wt 145 lb (65.772 kg)  BMI 22.71 kg/m2  SpO2 97% Physical Exam  Constitutional: He is oriented to person, place, and time. He appears well-developed and well-nourished. No distress.  HENT:  Head: Normocephalic and atraumatic.  Mouth/Throat: Oropharynx is clear and moist. No oropharyngeal exudate.  Eyes: Conjunctivae and EOM are normal. Pupils are equal, round, and reactive to light.  Neck: Normal range of motion. Neck supple.  No meningismus.  Cardiovascular: Normal rate, regular rhythm, normal heart sounds and intact distal pulses.   No murmur heard. Pulmonary/Chest: Accessory muscle usage (minimal) present. He is in respiratory distress (moderate). He has wheezes.  Speaking in short phrases. Diffuse expiratory and inspiratory wheezing. Minimal accessory muscle use.   Abdominal: Soft. There is no tenderness. There is no rebound and no guarding.  Musculoskeletal: Normal range of motion. He exhibits no edema or tenderness.  Neurological: He is alert and oriented to person, place, and time. No cranial nerve deficit. He exhibits normal muscle tone. Coordination normal.  No ataxia on finger to nose bilaterally. No pronator drift. 5/5 strength throughout. CN 2-12 intact.Equal grip strength. Sensation intact.   Skin: Skin is  warm.  Psychiatric: He has a normal mood and affect. His behavior is normal.  Nursing note and vitals reviewed.   ED Course  Procedures (including critical care time)  DIAGNOSTIC STUDIES: Oxygen Saturation is 96% on RA, adequate by my interpretation.    COORDINATION OF CARE: 11:40 AM Will order labs and imaging studies. Discussed treatment plan with pt at bedside and pt agreed to plan.  1:03 PM Rechecked patient. Continues to wheeze but good air exchange; nebulizer in process.  1:57 PM Updated on lab and imaging results. Discussed need for hospitalization. Patient is agreeable to plan.    Labs Review Labs Reviewed  CBC WITH DIFFERENTIAL/PLATELET - Abnormal; Notable for the following:    Lymphs Abs 0.4 (*)    All other components within normal limits  BASIC METABOLIC PANEL - Abnormal; Notable for the following:    Glucose, Bld 122 (*)    BUN 29 (*)    Calcium 8.6 (*)    All other components within normal limits  TROPONIN I    Imaging Review Dg Chest 2 View  03/09/2015  CLINICAL DATA:  82 y/o male here with c/o SOB since yesterday, this is a chronic condition but was worse yesterday. No other complaints. Hx asthma, COPD, HTN - on meds, current smoker EXAM: CHEST  2 VIEW COMPARISON:  Radiograph 01/22/ 2017 FINDINGS: Normal cardiac silhouette. There is increased atelectasis/ fluid along the horizontal fissure in the RIGHT lung. There is bibasilar linear opacities increased from prior. Mild airspace component on the RIGHT. Lungs are hyperinflated. RIGHT shoulder arthroplasty. IMPRESSION: 1. Increase in bibasilar atelectasis versus infiltrates. 2. New fluid along the RIGHT horizontal fissure. Electronically Signed   By: [name] M.D.   On: 03/10/2015 12:21   I have personally reviewed and evaluated these images and lab results as part of my medical decision-making.   EKG Interpretation   Date/Time:  Saturday February 14 2015 11:46:14 EST Ventricular Rate:  90 PR Interval:   148 QRS Duration: 130 QT Interval:  390 QTC Calculation: 477 R Axis:   -34 Text Interpretation:  Sinus rhythm Left bundle branch block No significant  change was found Confirmed by Manus Gunning  MD, Ellora Varnum ([id number]) on 02/22/2015  11:56:44 AM      MDM   Final diagnoses:  COPD exacerbation (HCC)   Worsening shortness of breath with coughing over the past 2 days. History of COPD with recent admission 2 weeks ago. Denies chest pain.  Dyspneic and wheezing on exam. Given nebulizers and steroids. Chest x-ray shows bibasilar atelectasis.  EKG is unchanged. Patient received continuous nebulizer, steroids, magnesium. Continues to be dyspneic with desaturation to the mid 80s with ambulation. He is comfortable at rest very dyspneic with exertion.  Additional nebulizer given. Plan admission for further treatment. Discussed with Dr. Butler Denmark.   CRITICAL CARE Performed by: Glynn Octave Total critical care time: 30 minutes Critical care time was exclusive of separately billable procedures and treating other patients. Critical care was necessary to treat or prevent imminent or life-threatening deterioration. Critical care was time spent personally by me on the following activities: development of treatment plan with patient and/or surrogate as well as nursing, discussions with consultants, evaluation of patient's response to treatment, examination of patient, obtaining history from patient or surrogate, ordering and performing treatments and interventions, ordering and review of laboratory studies, ordering and review of radiographic studies, pulse oximetry and re-evaluation of patient's condition.    I personally performed the services described in this documentation, which was scribed in my presence. The recorded information has been reviewed and is accurate.   Glynn Octave, MD 02/13/2015 [phone]

## 2015-02-14 NOTE — ED Notes (Signed)
[facility] Transport Team at bedside 

## 2015-02-14 NOTE — ED Notes (Signed)
Phone Hand Report given to [employer], opportunity for questions provided

## 2015-02-14 NOTE — ED Notes (Signed)
Patient ambulated in hall quick steady gait, HR 110-120, RR 18-26, +DOE, SpO2 89-92% on room air. Once back in room SpO2 increased to 95% on room air.

## 2015-02-14 NOTE — ED Notes (Signed)
MD at bedside. 

## 2015-02-14 NOTE — ED Notes (Signed)
Resting quietly at this time, voices no complaints 

## 2015-02-14 NOTE — Plan of Care (Signed)
[facility]  Called for a Direct admit.  82 y/ o male with COPD exacerbation. Dyspnea at rest and with exertion. Hypoxia with pulse ox dropping to 80s on exertion. Per history, no cough. Possible bibasilar infiltrated on CXR.  Has received nebulizer treatments and Solumedrol so far. Was recently admitted for the same on on 1/23.  I have accepted him to a med/surg bed.  Calvert Cantor, MD

## 2015-02-14 NOTE — ED Notes (Signed)
Pt recently DC from hospital, had onset of SOB today, became worse over the past two days. When attempting to move self in bed, has DOE. Placed on cont POX monitoring, RT staff at bedside providing HHN tx

## 2015-02-14 NOTE — ED Notes (Signed)
When pt attempted to move self on stretcher and to stand to use urinal, pt became very SOB

## 2015-02-14 NOTE — ED Notes (Signed)
Family at bedside. 

## 2015-02-14 NOTE — H&P (Signed)
[facility] History and Physical  DINNIS ROG WGN:[id number] DOB: 1933-08-16 DOA: 03/06/2015  Referring physician: Glynn Octave, M.D. PCP: [name], MD   Chief Complaint: shortness of breath.   HPI: COSTAS SENA is a 82 y.o. male with a past medical history of COPD, active cigarette smoker, hypertension, restless leg syndrome who presented to [facility] ED due to worsening dyspnea in the past week, but particularly worse in the last 2 days.  Per patient, he first had symptoms back in October and since then has required 3 hospitalizations including this one. His last admission was last month on the 23rd when he was admitted overnight for COPD exacerbation and was discharged home on oral azithromycin and prednisone taper. He states that he felt better after discharge, but since about a week he has been getting progressively worse dyspnea as he has been tapering off his prednisone. He has been using his Spiriva and Symbicort inhalers without substantial relief and has been using his albuterol MDI and albuterol nebulizer treatments at home. He denies fever, chills, but feels very fatigued. He is states that sometimes he gets fatigued just from talking. He denies sick contacts or travel history to his knowledge. He denies chest pain, palpitations, dizziness, diaphoresis, PND or orthopnea. He occasionally gets mild pitting edema of the lower extremities for which he takes furosemide 20 mg as needed with potassium supplementation.  When seen, the patient was in no acute distress, but would become mildly tachypneic if he talked without pausing. Workup reveals an abnormal chest checks radiograph with atelectasis and hyperinflation.  Review of Systems:  Constitutional:  Positive fatigue. No weight loss, night sweats, Fevers, chills,  HEENT:  Occasional rhinorrhea and nasal congestion. No headaches, Difficulty swallowing,Tooth/dental problems,Sore throat,  No sneezing, itching, ear  ache,  post nasal drip,  Cardio-vascular:  Occasional swelling in lower extremities.  No chest pain, Orthopnea, PND, anasarca, dizziness, palpitations  GI:  No heartburn, indigestion, abdominal pain, nausea, vomiting, diarrhea, change in bowel habits, loss of appetite  Resp: Positive dyspnea, Positive for cough, mostly nonproductive and occasional whitish or clear sputum, positive wheezing, denies hemoptysis.  Skin:  Occasional rash that he treats with ketoconazole cream. GU:  Nocturia once or twice at night. no dysuria, change in color of urine, no urgency or frequency. No flank pain.  Musculoskeletal:  Occasional arthralgias. No decreased range of motion.Occasional  back pain.  Psych:  No change in mood or affect. No depression or anxiety. No memory loss.   Past Medical History  Diagnosis Date  . COPD (chronic obstructive pulmonary disease) (HCC)   . Hypertension   . Asthma   . Restless leg syndrome    Past Surgical History  Procedure Laterality Date  . Hernia repair    . Back surgery    . Rotator cuff repair    . Knee arthroplasty     Social History:  reports that he has been smoking Cigarettes.  He has a 10 pack-year smoking history. He has never used smokeless tobacco. He reports that he does not drink alcohol or use illicit drugs.  No Known Allergies  Family History  Problem Relation Age of Onset  . Asthma Mother     Prior to Admission medications   Medication Sig Start Date End Date Taking? Authorizing Provider  albuterol (PROVENTIL HFA;VENTOLIN HFA) 108 (90 Base) MCG/ACT inhaler Inhale 2 puffs into the lungs every 6 (six) hours as needed for wheezing or shortness of breath.   Yes Historical Provider, MD  amLODipine (NORVASC) 5 MG tablet Take 5 mg by mouth daily.   Yes Historical Provider, MD  aspirin EC 81 MG EC tablet Take 1 tablet (81 mg total) by mouth daily. 10/26/14  Yes Lonia Blood, MD  budesonide-formoterol [facility]) 160-4.5 MCG/ACT inhaler Take 2  puffs first thing in am and then another 2 puffs about 12 hours later. Patient taking differently: Inhale 2 puffs into the lungs every 12 (twelve) hours.  08/14/14  Yes [name], MD  clorazepate (TRANXENE) 3.75 MG tablet Take 3.75 mg by mouth daily as needed for anxiety.   Yes Historical Provider, MD  doxazosin (CARDURA) 2 MG tablet Take 2 mg by mouth daily.   Yes Historical Provider, MD  furosemide (LASIX) 20 MG tablet Take 20 mg by mouth daily as needed for fluid (ankle swelling).  12/23/13  Yes Historical Provider, MD  guaiFENesin (MUCINEX) 600 MG 12 hr tablet Take 1 tablet (600 mg total) by mouth 2 (two) times daily. 02/02/15  Yes Dawood S Elgergawy, MD  ipratropium-albuterol (DUONEB) 0.5-2.5 (3) MG/3ML SOLN Take 3 mLs by nebulization every 6 (six) hours as needed (wheezing).   Yes Historical Provider, MD  ketoconazole (NIZORAL) 2 % cream Apply 1 application topically daily as needed (rash).  12/15/14  Yes Historical Provider, MD  losartan (COZAAR) 100 MG tablet Take 100 mg by mouth daily.   Yes Historical Provider, MD  naproxen (NAPROSYN) 375 MG tablet Take 375 mg by mouth daily. 02/06/15  Yes Historical Provider, MD  Polyvinyl Alcohol-Povidone (REFRESH OP) Place 1 drop into both eyes daily as needed (dry eyes).   Yes Historical Provider, MD  potassium chloride SA (K-DUR,KLOR-CON) 20 MEQ tablet Take 10 mEq by mouth daily as needed (take with furosemide dose).  12/23/13  Yes Historical Provider, MD  predniSONE (DELTASONE) 10 MG tablet Take  4 each am x 2 days,  3 each am x 2 days,  2 each am x 2 days, 1 each am x 2 days  ,then  Stop. Patient taking differently: Take 10-40 mg by mouth daily with breakfast. Tapered course prescribed 02/02/15: take 4 tablets (40 mg) by mouth daily for 2 days, then take 3 tablets (30 mg) daily for 2 days, then take 2 tablets (20 mg) daily for 2 days, then take 1 tablet  (10 mg) daily for 2 days, then stop 02/02/15  Yes Leana Roe Elgergawy, MD  rOPINIRole (REQUIP) 1 MG  tablet Take 1 mg by mouth 2 (two) times daily before lunch and supper.   Yes Historical Provider, MD  Tiotropium Bromide Monohydrate (SPIRIVA RESPIMAT) 2.5 MCG/ACT AERS 2 pffs each am Patient taking differently: Inhale 2 puffs into the lungs daily. 2 pffs each a 12/01/14  Yes [name], MD  traMADol (ULTRAM) 50 MG tablet Take 50 mg by mouth 3 (three) times daily as needed for moderate pain.    Yes Historical Provider, MD   Physical Exam: Filed Vitals:   Feb 28, 2015 1718 02/28/15 1730 02/28/2015 1846 28-Feb-2015 2013  BP:  121/72 157/69   Pulse:  109 103 102  Temp:   97.7 F (36.5 C)   TempSrc:   Oral   Resp:  Height:      Weight:      SpO2: 94% 92% 98% 97%    Wt Readings from Last 3 Encounters:  02-28-15 65.772 kg (145 lb)  02/01/15 66.679 kg (147 lb)  12/29/14 67.042 kg (147 lb 12.8 oz)    General:  Appears  calm and comfortable Eyes: PERRL, normal lids, irises & conjunctiva ENT: grossly normal hearing, lips & tongue Neck: no LAD, masses or thyromegaly Cardiovascular: Regular rhythm, tachycardic, no m/r/g. No LE edema. Telemetry: Sinus tachycardia.  Respiratory:  Decreased breath sounds bilaterally, particularly at the bases. Bilateral wheezing and bilateral rhonchi heard. No accessory muscle use. Abdomen: soft, ntnd Skin: no rash or induration seen on limited exam Musculoskeletal: grossly normal tone BUE/BLE Psychiatric: grossly normal mood and affect, speech fluent and appropriate Neurologic:  awake, alert, oriented 3, grossly non-focal.          Labs on Admission:  Basic Metabolic Panel:  Recent Labs Lab Feb 28, 2015 1145  NA 142  K 4.2  CL 109  CO2 25  GLUCOSE 122*  BUN 29*  CREATININE 1.09  CALCIUM 8.6*   CBC:  Recent Labs Lab 02/28/2015 1145  WBC 7.3  NEUTROABS 6.5  HGB 14.4  HCT 44.2  MCV 96.9  PLT 150   Cardiac Enzymes:  Recent Labs Lab February 28, 2015 1145  TROPONINI <0.03    BNP (last 3 results)  Recent Labs  10/24/14 1404  02/01/15 2320  BNP 71.9 46.0     Radiological Exams on Admission: Dg Chest 2 View  February 28, 2015  CLINICAL DATA:  82 y/o male here with c/o SOB since yesterday, this is a chronic condition but was worse yesterday. No other complaints. Hx asthma, COPD, HTN - on meds, current smoker EXAM: CHEST  2 VIEW COMPARISON:  Radiograph 01/22/ 2017 FINDINGS: Normal cardiac silhouette. There is increased atelectasis/ fluid along the horizontal fissure in the RIGHT lung. There is bibasilar linear opacities increased from prior. Mild airspace component on the RIGHT. Lungs are hyperinflated. RIGHT shoulder arthroplasty. IMPRESSION: 1. Increase in bibasilar atelectasis versus infiltrates. 2. New fluid along the RIGHT horizontal fissure. Electronically Signed   By: [name] M.D.   On: February 28, 2015 12:21    EKG: Independently reviewed. Vent. rate 90 BPM PR interval 148 ms QRS duration 130 ms QT/QTc 390/477 ms P-R-T axes 62 -34 93 Sinus rhythm Left bundle branch block  Assessment/Plan Principal Problem:   COPD exacerbation (HCC)   COPD  GOLD II Admit to telemetry for observation. Check a sputum Gram stain, culture and sensitivity. Continue supplemental oxygen. Continue bronchodilators every 6 hours and as needed. Continue IV methylprednisolone. Start levofloxacin 750 mg IVP every 24 hours.  Active Problems:   Essential hypertension, benign Continue amlodipine 5 mg by mouth daily. Continue losartan 100 mg by mouth daily. Monitor blood pressure.      Cigarette smoker Declined nicotine replacement therapy.    Restless leg syndrome Continue reck with 1 mg by mouth twice a day.    Code Status: Full code. DVT Prophylaxis: Lovenox SQ. Family Communication:  Disposition :admit to observation for COPD exacerbation treatment.   Time spent: Over 70 minutes were spent in the process of this admission.   Bobette Mo M.D. [facility] Pager [phone].

## 2015-02-14 NOTE — ED Notes (Signed)
Family informed of approx time of transport team arrival

## 2015-02-14 NOTE — ED Notes (Signed)
Reports SOB with exertion since October- states has been in hospital x 3. Reports increasing SOB x 1 week. States "if I don't do anything I feel fine"- RT in triage to eval

## 2015-02-15 DIAGNOSIS — J430 Unilateral pulmonary emphysema [MacLeod's syndrome]: Secondary | ICD-10-CM | POA: Diagnosis not present

## 2015-02-15 DIAGNOSIS — G2581 Restless legs syndrome: Secondary | ICD-10-CM

## 2015-02-15 DIAGNOSIS — I472 Ventricular tachycardia: Secondary | ICD-10-CM | POA: Diagnosis not present

## 2015-02-15 DIAGNOSIS — Z7952 Long term (current) use of systemic steroids: Secondary | ICD-10-CM | POA: Diagnosis not present

## 2015-02-15 DIAGNOSIS — Z7982 Long term (current) use of aspirin: Secondary | ICD-10-CM | POA: Diagnosis not present

## 2015-02-15 DIAGNOSIS — Z7951 Long term (current) use of inhaled steroids: Secondary | ICD-10-CM | POA: Diagnosis not present

## 2015-02-15 DIAGNOSIS — J9621 Acute and chronic respiratory failure with hypoxia: Secondary | ICD-10-CM | POA: Diagnosis present

## 2015-02-15 DIAGNOSIS — E875 Hyperkalemia: Secondary | ICD-10-CM | POA: Diagnosis not present

## 2015-02-15 DIAGNOSIS — J45909 Unspecified asthma, uncomplicated: Secondary | ICD-10-CM | POA: Diagnosis present

## 2015-02-15 DIAGNOSIS — R339 Retention of urine, unspecified: Secondary | ICD-10-CM | POA: Diagnosis not present

## 2015-02-15 DIAGNOSIS — K922 Gastrointestinal hemorrhage, unspecified: Secondary | ICD-10-CM | POA: Diagnosis not present

## 2015-02-15 DIAGNOSIS — N183 Chronic kidney disease, stage 3 unspecified: Secondary | ICD-10-CM

## 2015-02-15 DIAGNOSIS — J9601 Acute respiratory failure with hypoxia: Secondary | ICD-10-CM | POA: Diagnosis not present

## 2015-02-15 DIAGNOSIS — N179 Acute kidney failure, unspecified: Secondary | ICD-10-CM | POA: Diagnosis not present

## 2015-02-15 DIAGNOSIS — Z79899 Other long term (current) drug therapy: Secondary | ICD-10-CM | POA: Diagnosis not present

## 2015-02-15 DIAGNOSIS — F17210 Nicotine dependence, cigarettes, uncomplicated: Secondary | ICD-10-CM | POA: Diagnosis present

## 2015-02-15 DIAGNOSIS — Z720 Tobacco use: Secondary | ICD-10-CM | POA: Diagnosis not present

## 2015-02-15 DIAGNOSIS — E861 Hypovolemia: Secondary | ICD-10-CM | POA: Diagnosis not present

## 2015-02-15 DIAGNOSIS — Y95 Nosocomial condition: Secondary | ICD-10-CM | POA: Diagnosis present

## 2015-02-15 DIAGNOSIS — I129 Hypertensive chronic kidney disease with stage 1 through stage 4 chronic kidney disease, or unspecified chronic kidney disease: Secondary | ICD-10-CM | POA: Diagnosis present

## 2015-02-15 DIAGNOSIS — J440 Chronic obstructive pulmonary disease with acute lower respiratory infection: Secondary | ICD-10-CM | POA: Diagnosis present

## 2015-02-15 DIAGNOSIS — R918 Other nonspecific abnormal finding of lung field: Secondary | ICD-10-CM | POA: Diagnosis present

## 2015-02-15 DIAGNOSIS — I447 Left bundle-branch block, unspecified: Secondary | ICD-10-CM | POA: Diagnosis present

## 2015-02-15 DIAGNOSIS — J441 Chronic obstructive pulmonary disease with (acute) exacerbation: Secondary | ICD-10-CM | POA: Diagnosis not present

## 2015-02-15 DIAGNOSIS — R001 Bradycardia, unspecified: Secondary | ICD-10-CM | POA: Diagnosis not present

## 2015-02-15 DIAGNOSIS — M79606 Pain in leg, unspecified: Secondary | ICD-10-CM | POA: Diagnosis not present

## 2015-02-15 DIAGNOSIS — E872 Acidosis: Secondary | ICD-10-CM | POA: Diagnosis not present

## 2015-02-15 DIAGNOSIS — I10 Essential (primary) hypertension: Secondary | ICD-10-CM | POA: Diagnosis not present

## 2015-02-15 DIAGNOSIS — N281 Cyst of kidney, acquired: Secondary | ICD-10-CM | POA: Diagnosis present

## 2015-02-15 DIAGNOSIS — R600 Localized edema: Secondary | ICD-10-CM | POA: Diagnosis present

## 2015-02-15 DIAGNOSIS — J189 Pneumonia, unspecified organism: Secondary | ICD-10-CM | POA: Diagnosis present

## 2015-02-15 DIAGNOSIS — I959 Hypotension, unspecified: Secondary | ICD-10-CM | POA: Diagnosis not present

## 2015-02-15 DIAGNOSIS — Z825 Family history of asthma and other chronic lower respiratory diseases: Secondary | ICD-10-CM | POA: Diagnosis not present

## 2015-02-15 LAB — CBC WITH DIFFERENTIAL/PLATELET
Basophils Absolute: 0 10*3/uL (ref 0.0–0.1)
Basophils Relative: 0 %
EOS ABS: 0 10*3/uL (ref 0.0–0.7)
EOS PCT: 0 %
HCT: 39 % (ref 39.0–52.0)
HEMOGLOBIN: 12.9 g/dL — AB (ref 13.0–17.0)
LYMPHS ABS: 0.3 10*3/uL — AB (ref 0.7–4.0)
LYMPHS PCT: 2 %
MCH: 32.6 pg (ref 26.0–34.0)
MCHC: 33.1 g/dL (ref 30.0–36.0)
MCV: 98.5 fL (ref 78.0–100.0)
MONOS PCT: 4 %
Monocytes Absolute: 0.5 10*3/uL (ref 0.1–1.0)
Neutro Abs: 11.9 10*3/uL — ABNORMAL HIGH (ref 1.7–7.7)
Neutrophils Relative %: 94 %
Platelets: 143 10*3/uL — ABNORMAL LOW (ref 150–400)
RBC: 3.96 MIL/uL — AB (ref 4.22–5.81)
RDW: 14.5 % (ref 11.5–15.5)
WBC: 12.7 10*3/uL — AB (ref 4.0–10.5)

## 2015-02-15 LAB — COMPREHENSIVE METABOLIC PANEL
ALBUMIN: 2.9 g/dL — AB (ref 3.5–5.0)
ALK PHOS: 49 U/L (ref 38–126)
ALT: 23 U/L (ref 17–63)
AST: 21 U/L (ref 15–41)
Anion gap: 8 (ref 5–15)
BILIRUBIN TOTAL: 0.5 mg/dL (ref 0.3–1.2)
BUN: 37 mg/dL — AB (ref 6–20)
CALCIUM: 8.5 mg/dL — AB (ref 8.9–10.3)
CO2: 25 mmol/L (ref 22–32)
CREATININE: 1.54 mg/dL — AB (ref 0.61–1.24)
Chloride: 109 mmol/L (ref 101–111)
GFR calc Af Amer: 48 mL/min — ABNORMAL LOW (ref >60)
GFR, EST NON AFRICAN AMERICAN: 41 mL/min — AB (ref >60)
GLUCOSE: 167 mg/dL — AB (ref 65–99)
POTASSIUM: 4.8 mmol/L (ref 3.5–5.1)
Sodium: 142 mmol/L (ref 135–145)
TOTAL PROTEIN: 4.8 g/dL — AB (ref 6.5–8.1)

## 2015-02-15 LAB — MAGNESIUM: MAGNESIUM: 2.8 mg/dL — AB (ref 1.7–2.4)

## 2015-02-15 LAB — PHOSPHORUS: Phosphorus: 4.5 mg/dL (ref 2.5–4.6)

## 2015-02-15 MED ORDER — BUDESONIDE 0.5 MG/2ML IN SUSP
0.5000 mg | Freq: Two times a day (BID) | RESPIRATORY_TRACT | Status: DC
  Administered 2015-02-15 – 2015-02-16 (×3): 0.5 mg via RESPIRATORY_TRACT
  Filled 2015-02-15 (×6): qty 2

## 2015-02-15 MED ORDER — SODIUM CHLORIDE 0.9 % IV SOLN
INTRAVENOUS | Status: AC
  Administered 2015-02-15 (×2): via INTRAVENOUS
  Filled 2015-02-15: qty 1000

## 2015-02-15 NOTE — Care Management Obs Status (Signed)
MEDICARE OBSERVATION STATUS NOTIFICATION   Patient Details  Name: Joe Gonzalez MRN: [id number] Date of Birth: 09/28/1933   Medicare Observation Status Notification Given:  Yes  C44 given.  Yves Dill, RN 02/15/2015, 4:54 PM

## 2015-02-15 NOTE — Progress Notes (Signed)
PROGRESS NOTE  Joe Gonzalez JXB:[id number] DOB: 10-15-1933 DOA: 2015-02-27 PCP: [name], MD Brief History 82 y/o male with history of COPD, continued tobacco use, HTN presented with 1 week history of increasing shortness of breath that has worsened over the past 2 days. The patient was recently discharged from the hospital on 02/02/2015 after a one-day stay for COPD exacerbation. The patient was discharged home with a tapering dose of prednisone and azithromycin. The patient stated that he was feeling better, but his dyspnea worsened again when he finished his prednisone taper. He endorses compliance with his Spiriva and Symbicort as well as his albuterol nebulizers. He denied any fevers, chills, chest pain, nausea, vomiting, diarrhea, abdominal pain.   Assessment/Plan: COPD exacerbation  -Continue intravenous steroids  -Continue bronchodilators  -Continue levofloxacin  -Add budesonide nebulizer  -Pulmonary hygiene  Hypertension -Continue losartan, amlodipine and Cardura Tobacco abuse  -Tobacco cessation discussed  Restless leg syndrome  -Continue Requip  -Check iron studies  LBBB -unchanged on EKG CKD stage 2-3 -baseline creatinine 1.0-1.3 -dc losartan due to increasing serum creatinine  Family Communication:   Wife update at beside 2/5 Disposition Plan:   Home when medically stable       Procedures/Studies: Dg Chest 2 View  February 27, 2015  CLINICAL DATA:  82 y/o male here with c/o SOB since yesterday, this is a chronic condition but was worse yesterday. No other complaints. Hx asthma, COPD, HTN - on meds, current smoker EXAM: CHEST  2 VIEW COMPARISON:  Radiograph 01/22/ 2017 FINDINGS: Normal cardiac silhouette. There is increased atelectasis/ fluid along the horizontal fissure in the RIGHT lung. There is bibasilar linear opacities increased from prior. Mild airspace component on the RIGHT. Lungs are hyperinflated. RIGHT shoulder arthroplasty. IMPRESSION: 1.  Increase in bibasilar atelectasis versus infiltrates. 2. New fluid along the RIGHT horizontal fissure. Electronically Signed   By: [name] M.D.   On: February 27, 2015 12:21   Dg Chest Portable 1 View  02/02/2015  CLINICAL DATA:  Worsening shortness of breath. Sudden onset 5 hours ago. Chest tightness. Bilateral lower extremity swelling. Wheezing. EXAM: PORTABLE CHEST 1 VIEW COMPARISON:  10/24/2014 FINDINGS: Normal heart size and pulmonary vascularity. Emphysematous changes in the lungs. Linear fibrosis or atelectasis in the mid lungs and lung bases. No focal airspace disease or consolidation. No blunting of costophrenic angles. No pneumothorax. Calcified and tortuous aorta. Postoperative changes in both shoulders. IMPRESSION: Emphysematous changes and scattered fibrosis in the lungs. No evidence of active pulmonary disease. Electronically Signed   By: [name] M.D.   On: 02/02/2015 00:18         Subjective: Pt states breathing not much better than yesterday.  Denies f/,c ,cp, n/v/d, abdominal pain, hemoptysis  Objective: Filed Vitals:   02/15/15 0638 02/15/15 0700 02/15/15 0722 02/15/15 0908  BP: 141/57 142/58  144/72  Pulse: 73 89  114  Temp: 98.2 F (36.8 C) 97.7 F (36.5 C)  97.5 F (36.4 C)  TempSrc: Oral Oral  Oral  Resp: Height:      Weight:  69.8 kg (153 lb 14.1 oz)    SpO2: 98% 95% 97% 97%    Intake/Output Summary (Last 24 hours) at 02/15/15 1110 Last data filed at 02/15/15 1105  Gross per 24 hour  Intake    240 ml  Output    300 ml  Net    -60 ml   Weight change:  Exam:   General:  Pt is alert, follows commands appropriately, not in acute distress  HEENT: No icterus, No thrush, No neck mass, Lynch/AT  Cardiovascular: RRR, S1/S2, no rubs, no gallops  Respiratory: bilateral expiratory wheeze  Abdomen: Soft/+BS, non tender, non distended, no guarding; no hepatosplenomegaly  Extremities: No edema, No lymphangitis, No petechiae, No rashes, no  synovitis; clubbing without cyanosis  Data Reviewed: Basic Metabolic Panel:  Recent Labs Lab 02/19/2015 1145 02/22/2015 2239 02/15/15 0544  NA 142  --  142  K 4.2  --  4.8  CL 109  --  109  CO2 25  --  25  GLUCOSE 122*  --  167*  BUN 29*  --  37*  CREATININE 1.09 1.49* 1.54*  CALCIUM 8.6*  --  8.5*  MG  --   --  2.8*  PHOS  --   --  4.5   Liver Function Tests:  Recent Labs Lab 02/15/15 0544  AST 21  ALT 23  ALKPHOS 49  BILITOT 0.5  PROT 4.8*  ALBUMIN 2.9*   No results for input(s): LIPASE, AMYLASE in the last 168 hours. No results for input(s): AMMONIA in the last 168 hours. CBC:  Recent Labs Lab 02/13/2015 1145 02/13/2015 2239 02/15/15 0544  WBC 7.3 7.2 12.7*  NEUTROABS 6.5  --  11.9*  HGB 14.4 13.9 12.9*  HCT 44.2 40.9 39.0  MCV 96.9 97.6 98.5  PLT 150 156 143*   Cardiac Enzymes:  Recent Labs Lab 03/06/2015 1145  TROPONINI <0.03   BNP: Invalid input(s): POCBNP CBG: No results for input(s): GLUCAP in the last 168 hours.  No results found for this or any previous visit (from the past 240 hour(s)).   Scheduled Meds: . sodium chloride   Intravenous STAT  . amLODipine  5 mg Oral Daily  . aspirin EC  81 mg Oral Daily  . doxazosin  2 mg Oral Daily  . enoxaparin (LOVENOX) injection  40 mg Subcutaneous Q24H  . guaiFENesin  600 mg Oral BID  . ipratropium-albuterol  3 mL Nebulization Q6H  . levofloxacin (LEVAQUIN) IV  750 mg Intravenous Q24H  . losartan  100 mg Oral Daily  . methylPREDNISolone (SOLU-MEDROL) injection  80 mg Intravenous Q6H  . pantoprazole  40 mg Oral Daily  . rOPINIRole  1 mg Oral BID AC  . sodium chloride flush  3 mL Intravenous Q12H   Continuous Infusions:    Basil Blakesley, DO  [facility] Pager [phone]  If 7PM-7AM, please contact night-coverage [email or web address] Password [email or web address] 02/15/2015, 11:10 AM   LOS: 1 day

## 2015-02-16 ENCOUNTER — Encounter (HOSPITAL_COMMUNITY): Payer: Medicare Other

## 2015-02-16 ENCOUNTER — Inpatient Hospital Stay (HOSPITAL_COMMUNITY): Payer: Medicare Other

## 2015-02-16 ENCOUNTER — Encounter (HOSPITAL_COMMUNITY): Payer: Self-pay | Admitting: Pulmonary Disease

## 2015-02-16 DIAGNOSIS — J9601 Acute respiratory failure with hypoxia: Secondary | ICD-10-CM

## 2015-02-16 DIAGNOSIS — Z720 Tobacco use: Secondary | ICD-10-CM

## 2015-02-16 DIAGNOSIS — J441 Chronic obstructive pulmonary disease with (acute) exacerbation: Secondary | ICD-10-CM

## 2015-02-16 LAB — POCT I-STAT 3, ART BLOOD GAS (G3+)
ACID-BASE DEFICIT: 9 mmol/L — AB (ref 0.0–2.0)
ACID-BASE DEFICIT: 7 mmol/L — AB (ref 0.0–2.0)
Acid-base deficit: 7 mmol/L — ABNORMAL HIGH (ref 0.0–2.0)
BICARBONATE: 19.9 meq/L — AB (ref 20.0–24.0)
BICARBONATE: 20.9 meq/L (ref 20.0–24.0)
Bicarbonate: 21.9 meq/L (ref 20.0–24.0)
O2 Saturation: 98 %
O2 Saturation: 98 %
O2 Saturation: 95 %
PH ART: 7.205 — AB (ref 7.350–7.450)
PO2 ART: 129 mmHg — AB (ref 80.0–100.0)
PO2 ART: 94 mmHg (ref 80.0–100.0)
Patient temperature: 98
TCO2: 24 mmol/L (ref 0–100)
TCO2: 21 mmol/L (ref 0–100)
TCO2: 22 mmol/L (ref 0–100)
pCO2 arterial: 55.4 mmHg — ABNORMAL HIGH (ref 35.0–45.0)
pCO2 arterial: 54.5 mmHg — ABNORMAL HIGH (ref 35.0–45.0)
pCO2 arterial: 52.8 mmHg — ABNORMAL HIGH (ref 35.0–45.0)
pH, Arterial: 7.203 — ABNORMAL LOW (ref 7.350–7.450)
pH, Arterial: 7.17 — CL (ref 7.350–7.450)
pO2, Arterial: 126 mmHg — ABNORMAL HIGH (ref 80.0–100.0)

## 2015-02-16 LAB — BLOOD GAS, ARTERIAL
ACID-BASE DEFICIT: 3.5 mmol/L — AB (ref 0.0–2.0)
Acid-base deficit: 4.8 mmol/L — ABNORMAL HIGH (ref 0.0–2.0)
BICARBONATE: 21.2 meq/L (ref 20.0–24.0)
BICARBONATE: 20.8 meq/L (ref 20.0–24.0)
DELIVERY SYSTEMS: POSITIVE
DRAWN BY: 36277
Drawn by: 313941
Expiratory PAP: 6
FIO2: 0.28
FIO2: 0.4
Inspiratory PAP: 12
Mode: POSITIVE
O2 Content: 2 L/min
O2 SAT: 94.3 %
O2 SAT: 98.1 %
PATIENT TEMPERATURE: 98.6
PATIENT TEMPERATURE: 98
PO2 ART: 76 mmHg — AB (ref 80.0–100.0)
PO2 ART: 124 mmHg — AB (ref 80.0–100.0)
RATE: 8 resp/min
TCO2: 22.5 mmol/L (ref 0–100)
TCO2: 22.2 mmol/L (ref 0–100)
pCO2 arterial: 39.9 mmHg (ref 35.0–45.0)
pCO2 arterial: 45 mmHg (ref 35.0–45.0)
pH, Arterial: 7.345 — ABNORMAL LOW (ref 7.350–7.450)
pH, Arterial: 7.285 — ABNORMAL LOW (ref 7.350–7.450)

## 2015-02-16 LAB — FERRITIN: Ferritin: 49 ng/mL (ref 24–336)

## 2015-02-16 LAB — CBC
HEMATOCRIT: 42.9 % (ref 39.0–52.0)
HEMOGLOBIN: 14.1 g/dL (ref 13.0–17.0)
MCH: 32.6 pg (ref 26.0–34.0)
MCHC: 32.9 g/dL (ref 30.0–36.0)
MCV: 99.3 fL (ref 78.0–100.0)
Platelets: 142 10*3/uL — ABNORMAL LOW (ref 150–400)
RBC: 4.32 MIL/uL (ref 4.22–5.81)
RDW: 14.6 % (ref 11.5–15.5)
WBC: 19 10*3/uL — AB (ref 4.0–10.5)

## 2015-02-16 LAB — BASIC METABOLIC PANEL
Anion gap: 10 (ref 5–15)
BUN: 50 mg/dL — AB (ref 6–20)
CO2: 25 mmol/L (ref 22–32)
Calcium: 8.7 mg/dL — ABNORMAL LOW (ref 8.9–10.3)
Chloride: 105 mmol/L (ref 101–111)
Creatinine, Ser: 1.91 mg/dL — ABNORMAL HIGH (ref 0.61–1.24)
GFR calc Af Amer: 37 mL/min — ABNORMAL LOW (ref >60)
GFR, EST NON AFRICAN AMERICAN: 32 mL/min — AB (ref >60)
Glucose, Bld: 124 mg/dL — ABNORMAL HIGH (ref 65–99)
POTASSIUM: 5.4 mmol/L — AB (ref 3.5–5.1)
SODIUM: 140 mmol/L (ref 135–145)

## 2015-02-16 LAB — GLUCOSE, CAPILLARY: GLUCOSE-CAPILLARY: 129 mg/dL — AB (ref 65–99)

## 2015-02-16 LAB — TROPONIN I
TROPONIN I: 0.05 ng/mL — AB (ref <0.031)
Troponin I: 0.04 ng/mL — ABNORMAL HIGH (ref <0.031)

## 2015-02-16 LAB — MRSA PCR SCREENING: MRSA by PCR: NEGATIVE

## 2015-02-16 LAB — IRON AND TIBC
IRON: 65 ug/dL (ref 45–182)
SATURATION RATIOS: 26 % (ref 17.9–39.5)
TIBC: 249 ug/dL — AB (ref 250–450)
UIBC: 184 ug/dL

## 2015-02-16 LAB — TRIGLYCERIDES: Triglycerides: 50 mg/dL (ref <150)

## 2015-02-16 LAB — D-DIMER, QUANTITATIVE: D-Dimer, Quant: 0.66 ug/mL-FEU — ABNORMAL HIGH (ref 0.00–0.50)

## 2015-02-16 MED ORDER — FENTANYL CITRATE (PF) 100 MCG/2ML IJ SOLN: 400.0000 ug | Freq: Once | INTRAMUSCULAR | Status: DC

## 2015-02-16 MED ORDER — ANTISEPTIC ORAL RINSE SOLUTION (CORINZ)
7.0000 mL | Freq: Four times a day (QID) | OROMUCOSAL | Status: DC
  Administered 2015-02-17 – 2015-02-21 (×20): 7 mL via OROMUCOSAL

## 2015-02-16 MED ORDER — METOPROLOL TARTRATE 1 MG/ML IV SOLN
5.0000 mg | Freq: Four times a day (QID) | INTRAVENOUS | Status: DC
  Administered 2015-02-16 – 2015-02-17 (×3): 5 mg via INTRAVENOUS
  Filled 2015-02-16 (×7): qty 5

## 2015-02-16 MED ORDER — DEXTROSE 5 % IV SOLN
2.0000 g | Freq: Two times a day (BID) | INTRAVENOUS | Status: DC
  Administered 2015-02-16: 2 g via INTRAVENOUS
  Filled 2015-02-16 (×3): qty 2

## 2015-02-16 MED ORDER — FENTANYL CITRATE (PF) 100 MCG/2ML IJ SOLN
INTRAMUSCULAR | Status: AC
  Filled 2015-02-16: qty 4

## 2015-02-16 MED ORDER — FENTANYL CITRATE (PF) 100 MCG/2ML IJ SOLN
200.0000 ug | Freq: Once | INTRAMUSCULAR | Status: AC
  Administered 2015-02-16: 200 ug via INTRAVENOUS

## 2015-02-16 MED ORDER — FENTANYL CITRATE (PF) 100 MCG/2ML IJ SOLN: 200.0000 ug | Freq: Once | INTRAMUSCULAR | Status: DC

## 2015-02-16 MED ORDER — FENTANYL CITRATE (PF) 100 MCG/2ML IJ SOLN
50.0000 ug | INTRAMUSCULAR | Status: DC | PRN
  Administered 2015-02-17 (×2): 50 ug via INTRAVENOUS
  Filled 2015-02-16 (×2): qty 2

## 2015-02-16 MED ORDER — PROPOFOL 1000 MG/100ML IV EMUL
INTRAVENOUS | Status: AC
  Filled 2015-02-16: qty 100

## 2015-02-16 MED ORDER — IPRATROPIUM BROMIDE 0.02 % IN SOLN
0.5000 mg | Freq: Four times a day (QID) | RESPIRATORY_TRACT | Status: DC
  Administered 2015-02-16 – 2015-02-17 (×5): 0.5 mg via RESPIRATORY_TRACT
  Filled 2015-02-16 (×5): qty 2.5

## 2015-02-16 MED ORDER — FENTANYL CITRATE (PF) 100 MCG/2ML IJ SOLN
50.0000 ug | INTRAMUSCULAR | Status: DC | PRN
  Administered 2015-02-17 – 2015-02-18 (×2): 50 ug via INTRAVENOUS
  Filled 2015-02-16 (×3): qty 2

## 2015-02-16 MED ORDER — ARFORMOTEROL TARTRATE 15 MCG/2ML IN NEBU
15.0000 ug | INHALATION_SOLUTION | Freq: Two times a day (BID) | RESPIRATORY_TRACT | Status: DC
  Administered 2015-02-16 – 2015-02-20 (×8): 15 ug via RESPIRATORY_TRACT
  Filled 2015-02-16 (×13): qty 2

## 2015-02-16 MED ORDER — PROPOFOL 1000 MG/100ML IV EMUL
0.0000 ug/kg/min | INTRAVENOUS | Status: DC
  Administered 2015-02-16: 20 ug/kg/min via INTRAVENOUS
  Administered 2015-02-17: 50 ug/kg/min via INTRAVENOUS
  Administered 2015-02-17: 30 ug/kg/min via INTRAVENOUS
  Administered 2015-02-17: 40 ug/kg/min via INTRAVENOUS
  Administered 2015-02-17: 45 ug/kg/min via INTRAVENOUS
  Administered 2015-02-17: 30 ug/kg/min via INTRAVENOUS
  Administered 2015-02-18 – 2015-02-19 (×8): 50 ug/kg/min via INTRAVENOUS
  Administered 2015-02-19: 30 ug/kg/min via INTRAVENOUS
  Administered 2015-02-19: 50 ug/kg/min via INTRAVENOUS
  Administered 2015-02-20: 20 ug/kg/min via INTRAVENOUS
  Administered 2015-02-20: 40 ug/kg/min via INTRAVENOUS
  Filled 2015-02-16 (×6): qty 100
  Filled 2015-02-16: qty 200
  Filled 2015-02-16 (×10): qty 100

## 2015-02-16 MED ORDER — AZITHROMYCIN 500 MG IV SOLR
500.0000 mg | INTRAVENOUS | Status: DC
  Administered 2015-02-16 – 2015-02-18 (×3): 500 mg via INTRAVENOUS
  Filled 2015-02-16 (×4): qty 500

## 2015-02-16 MED ORDER — DEXTROSE 5 % IV SOLN
2.0000 g | INTRAVENOUS | Status: DC
  Administered 2015-02-17: 2 g via INTRAVENOUS
  Filled 2015-02-16: qty 2

## 2015-02-16 MED ORDER — MIDAZOLAM HCL 2 MG/2ML IJ SOLN
INTRAMUSCULAR | Status: AC
Start: 2015-02-16 — End: 2015-02-16
  Filled 2015-02-16: qty 4

## 2015-02-16 MED ORDER — PROPOFOL 10 MG/ML IV BOLUS
80.0000 mg | Freq: Once | INTRAVENOUS | Status: AC
  Administered 2015-02-16: 80 mg via INTRAVENOUS

## 2015-02-16 MED ORDER — CHLORHEXIDINE GLUCONATE 0.12% ORAL RINSE (MEDLINE KIT)
15.0000 mL | Freq: Two times a day (BID) | OROMUCOSAL | Status: DC
  Administered 2015-02-16 – 2015-02-21 (×10): 15 mL via OROMUCOSAL

## 2015-02-16 MED ORDER — LEVALBUTEROL HCL 1.25 MG/0.5ML IN NEBU
1.2500 mg | INHALATION_SOLUTION | Freq: Four times a day (QID) | RESPIRATORY_TRACT | Status: DC
  Administered 2015-02-16 – 2015-02-17 (×5): 1.25 mg via RESPIRATORY_TRACT
  Filled 2015-02-16 (×8): qty 0.5

## 2015-02-16 MED ORDER — LORAZEPAM 2 MG/ML IJ SOLN
1.0000 mg | Freq: Once | INTRAMUSCULAR | Status: AC
  Administered 2015-02-16: 1 mg via INTRAVENOUS
  Filled 2015-02-16: qty 1

## 2015-02-16 MED ORDER — MIDAZOLAM HCL 2 MG/2ML IJ SOLN
4.0000 mg | Freq: Once | INTRAMUSCULAR | Status: AC
  Administered 2015-02-16: 4 mg via INTRAVENOUS

## 2015-02-16 MED ORDER — LEVALBUTEROL HCL 1.25 MG/0.5ML IN NEBU
1.2500 mg | INHALATION_SOLUTION | Freq: Four times a day (QID) | RESPIRATORY_TRACT | Status: DC
  Filled 2015-02-16 (×4): qty 0.5

## 2015-02-16 MED ORDER — RACEPINEPHRINE HCL 2.25 % IN NEBU
0.5000 mL | INHALATION_SOLUTION | Freq: Once | RESPIRATORY_TRACT | Status: AC
  Administered 2015-02-16: 0.5 mL via RESPIRATORY_TRACT
  Filled 2015-02-16: qty 0.5

## 2015-02-16 MED ORDER — ENOXAPARIN SODIUM 30 MG/0.3ML ~~LOC~~ SOLN
30.0000 mg | SUBCUTANEOUS | Status: DC
  Administered 2015-02-16 – 2015-02-18 (×3): 30 mg via SUBCUTANEOUS
  Filled 2015-02-16 (×4): qty 0.3

## 2015-02-16 MED ORDER — LEVALBUTEROL HCL 0.63 MG/3ML IN NEBU
0.6300 mg | INHALATION_SOLUTION | RESPIRATORY_TRACT | Status: DC | PRN
  Administered 2015-02-16: 0.63 mg via RESPIRATORY_TRACT
  Filled 2015-02-16: qty 3

## 2015-02-16 NOTE — Progress Notes (Signed)
Pt still c/o not feeling any better after resp tx. On call provider called and notified of pt condition.  Order to transfer to SDU with bipap.

## 2015-02-16 NOTE — Progress Notes (Signed)
Pt still c/o SOB and respiratory therapist very concerned.  Administered scheduled treatment and pt still in distress.  Rapid response and on call provider called and order for additional treatment given.  Treatment administered and will continue to monitor patient respiratory status.

## 2015-02-16 NOTE — Progress Notes (Signed)
Rt note: Pt transported to from 6e without event

## 2015-02-16 NOTE — Consult Note (Addendum)
PULMONARY / CRITICAL CARE MEDICINE   Name: Joe Gonzalez MRN: [id number] DOB: 1933-01-23    ADMISSION DATE:  Mar 11, 2015 CONSULTATION DATE:  02/16/2015  REFERRING MD:  Dr. Arbutus Leas with [facility]  CHIEF COMPLAINT:  Shortness of breath  HISTORY OF PRESENT ILLNESS:   This is a 82 year old male with a past medical history significant for severe COPD who has had multiple recurrent admissions since October 2016 who presented to the [facility] emergency department on March 11, 2015 complaining of shortness of breath. He tells me that he had been in his usual state of health but starting Friday morning he woke up with shortness of breath. He did not have mucus production but he has had a cough and wheezing. He reports that he has been compliant with his Symbicort and Spiriva. He also notes that he continues to smoke cigarettes. He attributes his lung problems to dust and "fume" exposure over the years. He says that he has not had a fever, chills, headache, or body aches. He denies any sort of sick contacts recently. According to both he and his wife he says that he never produces any mucus when he gets sick. He has noted some leg swelling in his right leg recently. He denies chest pain. He was admitted to our hospital on Mar 11, 2015 and was treated with bronchodilators as well as steroids. He became more short of breath overnight and was moved to the intensive care unit early this morning on 02/16/2015. He does note that after he received bronchodilator treatment early in the morning he felt that this made his breathing worse. Nursing notes that after his DuoNeb treatment this morning he did have a significant episode of tachycardia and tachypnea. He says that he is now feeling a little bit better.  PAST MEDICAL HISTORY :  He  has a past medical history of COPD (chronic obstructive pulmonary disease) (HCC); Hypertension; Asthma; and Restless leg syndrome.  PAST SURGICAL HISTORY: He  has past surgical  history that includes Hernia repair; Back surgery; Rotator cuff repair; and Knee Arthroplasty.  No Known Allergies  No current facility-administered medications on file prior to encounter.   Current Outpatient Prescriptions on File Prior to Encounter  Medication Sig  . amLODipine (NORVASC) 5 MG tablet Take 5 mg by mouth daily.  Marland Kitchen aspirin EC 81 MG EC tablet Take 1 tablet (81 mg total) by mouth daily.  . budesonide-formoterol (SYMBICORT) 160-4.5 MCG/ACT inhaler Take 2 puffs first thing in am and then another 2 puffs about 12 hours later. (Patient taking differently: Inhale 2 puffs into the lungs every 12 (twelve) hours. )  . clorazepate (TRANXENE) 3.75 MG tablet Take 3.75 mg by mouth daily as needed for anxiety.  Marland Kitchen doxazosin (CARDURA) 2 MG tablet Take 2 mg by mouth daily.  . furosemide (LASIX) 20 MG tablet Take 20 mg by mouth daily as needed for fluid (ankle swelling).   Marland Kitchen guaiFENesin (MUCINEX) 600 MG 12 hr tablet Take 1 tablet (600 mg total) by mouth 2 (two) times daily.  Marland Kitchen ipratropium-albuterol (DUONEB) 0.5-2.5 (3) MG/3ML SOLN Take 3 mLs by nebulization every 6 (six) hours as needed (wheezing).  Marland Kitchen losartan (COZAAR) 100 MG tablet Take 100 mg by mouth daily.  . potassium chloride SA (K-DUR,KLOR-CON) 20 MEQ tablet Take 10 mEq by mouth daily as needed (take with furosemide dose).   . predniSONE (DELTASONE) 10 MG tablet Take  4 each am x 2 days,  3 each am x 2 days,  2 each am x  2 days, 1 each am x 2 days  ,then  Stop. (Patient taking differently: Take 10-40 mg by mouth daily with breakfast. Tapered course prescribed 02/02/15: take 4 tablets (40 mg) by mouth daily for 2 days, then take 3 tablets (30 mg) daily for 2 days, then take 2 tablets (20 mg) daily for 2 days, then take 1 tablet  (10 mg) daily for 2 days, then stop)  . Tiotropium Bromide Monohydrate (SPIRIVA RESPIMAT) 2.5 MCG/ACT AERS 2 pffs each am (Patient taking differently: Inhale 2 puffs into the lungs daily. 2 pffs each a)  . traMADol  (ULTRAM) 50 MG tablet Take 50 mg by mouth 3 (three) times daily as needed for moderate pain.     FAMILY HISTORY:  He did not report a family history of lung disease  SOCIAL HISTORY: He  reports that he has been smoking Cigarettes.  He has a 10 pack-year smoking history. He has never used smokeless tobacco. He reports that he does not drink alcohol or use illicit drugs.  REVIEW OF SYSTEMS:   Could not obtain full review of systems secondary to using BiPAP.  SUBJECTIVE:  As above  VITAL SIGNS: BP 144/87 mmHg  Pulse 98  Temp(Src) 98 F (36.7 C) (Axillary)  Resp 22  Ht  (1.702 m)  Wt 73.2 kg (161 lb 6 oz)  BMI 25.27 kg/m2  SpO2 99%  HEMODYNAMICS:    VENTILATOR SETTINGS: Vent Mode:  [-]  FiO2 (%):  [40 %] 40 %  INTAKE / OUTPUT: I/O last 3 completed shifts: In: 720 [P.O.:720] Out: 0   PHYSICAL EXAMINATION: General:  Elderly male, tachypneic on BiPAP, some accessory muscle use Neuro:  Awake alert, oriented 4, moves all 4 extremities HEENT:  Normocephalic atraumatic, BiPAP mask in place, extraocular movements intact Cardiovascular:  Tachycardic but regular, distant heart sounds, no clear murmurs, Lungs:  Very poor air movement, some wheezing bilaterally, increased respiratory effort Abdomen:  Bowel sounds are positive, nontender nondistended Musculoskeletal:  Normal bulk and tone Skin:  Thin skin bilaterally with some bruising, no cyanosis  LABS:  BMET  Recent Labs Lab 02/12/2015 1145 02/24/2015 2239 02/15/15 0544  NA 142  --  142  K 4.2  --  4.8  CL 109  --  109  CO2 25  --  25  BUN 29*  --  37*  CREATININE 1.09 1.49* 1.54*  GLUCOSE 122*  --  167*    Electrolytes  Recent Labs Lab 02/27/2015 1145 02/15/15 0544  CALCIUM 8.6* 8.5*  MG  --  2.8*  PHOS  --  4.5    CBC  Recent Labs Lab 02/26/2015 1145 02/23/2015 2239 02/15/15 0544  WBC 7.3 7.2 12.7*  HGB 14.4 13.9 12.9*  HCT 44.2 40.9 39.0  PLT 150 156 143*    Coag's No results for input(s):  APTT, INR in the last 168 hours.  Sepsis Markers No results for input(s): LATICACIDVEN, PROCALCITON, O2SATVEN in the last 168 hours.  ABG  Recent Labs Lab 02/16/15 0430 02/16/15 1000  PHART 7.345* 7.285*  PCO2ART 39.9 45.0  PO2ART 76.0* 124*    Liver Enzymes  Recent Labs Lab 02/15/15 0544  AST 21  ALT 23  ALKPHOS 49  BILITOT 0.5  ALBUMIN 2.9*    Cardiac Enzymes  Recent Labs Lab 02/16/2015 1145  TROPONINI <0.03    Glucose  Recent Labs Lab 02/16/15 0553  GLUCAP 129*    Imaging 02/16/2015 chest x-ray images personally reviewed showing a questionable right lower lobe infiltrate  which is new compared to the images from 02/01/2015 there is also significant hyperinflation consistent with emphysema  STUDIES:  02/16/2015 echocardiogram pending 02/16/2015 lower extremities Doppler pending  CULTURES: None  ANTIBIOTICS: 02/16/2015 cefepime 02/16/2015 azithromycin  SIGNIFICANT EVENTS: Minerva Areola fourth 2017 admission 02/16/2015 moved to ICU for BiPAP  LINES/TUBES: February 16 2015 BiPAP  DISCUSSION: This is a 82 year old male who has gold stage II airflow obstruction but Gold grade D COPD based on multiple recurrent exacerbations and severe symptoms who continues to smoke cigarettes who is now in [facility] with acute on chronic hypoxemic respiratory failure secondary to a COPD exacerbation. He has a questionable right lung infiltrate with a slightly elevated white blood cell count in the setting of steroid use. Is not clear to me that he has pneumonia but considering the fact that his respiratory status has worsened think he needs anabolic coverage for now. He has recurrent COPD exacerbations because he continues to smoke cigarettes. This is explained to him at length today. I don't believe he thinks that cigarettes are actually causing a problem with his health. He is currently high risk for intubation.  Notably, he has had some leg swelling. Sometimes  pulmonary embolism can cause an acute exacerbation of COPD. He is certainly at risk for PE considering his multiple recent exacerbations.  ASSESSMENT / PLAN:  PULMONARY A: Acute respiratory failure with hypoxemia Acute exacerbation of COPD Active tobacco use Questionable healthcare associated pneumonia Intolerance of nebulized med? (unclear, became more dyspneic and tachycardic after duoneb and pulmicort) P:   Continue systemic steroids as ordered Add Brovana Stop albuterol Use Xopenex instead of albuterol Continued ipratropium Hold Pulmicort for now with systemic steroids Will add antibiotics Close monitoring in intensive care unit Continue BiPAP for now, low threshold for intubation Continue oxygen as needed to maintain O2 saturation greater than 90%  CARDIOVASCULAR A:  Sinus tachycardia Elevated JVD, questionable cor pulmonale Leg swelling, differential diagnosis includes edema versus like clot P:  Follow-up echocardiogram Telemetry monitoring Check d-dimer, if elevated would consider empiric anticoagulation until DVT or pulmonary embolism could be ruled out  RENAL A:   Worsening renal function, unclear cause P:   Check urinalysis and Urine Cr and Urine sodium Monitor BMET and UOP Replace electrolytes as needed  GASTROINTESTINAL A:   No acute issues  P:   Nothing by mouth while on BiPAP  HEMATOLOGIC A:   No acute issues P:  Monitor for bleeding  INFECTIOUS A:   Questionable healthcare associated pneumonia with worsening leukocytosis (the rising white count could be due to steroids) P:   Cefepime and azithromycin for now  ENDOCRINE A:   No acute issues   P:   Monitor glucose with lab work  NEUROLOGIC A:   No acute issues P:   Minimize sedating medications   FAMILY  - Updates: Wife updated at bedside by me on 02/16/2015  - Inter-disciplinary family meet or Palliative Care meeting due by:  Day 7   My cc time 40 minutes  Heber [place],  MD [facility] Pager: [phone] Cell: [phone] After 3pm or if no response, call [phone]   02/16/2015, 10:50 AM

## 2015-02-16 NOTE — Progress Notes (Signed)
Critical ABG results called to MD. Change RR from 16 to 20. Repeat ABG.

## 2015-02-16 NOTE — Procedures (Signed)
Intubation Procedure Note Joe Gonzalez [id number] 10/08/1933  Procedure: Intubation Indications: Respiratory insufficiency  Procedure Details Consent: Risks of procedure as well as the alternatives and risks of each were explained to the (patient/caregiver).  Consent for procedure obtained. Time Out: Verified patient identification, verified procedure, site/side was marked, verified correct patient position, special equipment/implants available, medications/allergies/relevent history reviewed, required imaging and test results available.  Performed  Maximum sterile technique was used including gloves, hand hygiene and mask.  MAC and 4 Glidescope    Evaluation Hemodynamic Status: BP stable throughout; O2 sats: stable throughout Patient's Current Condition: stable Complications: No apparent complications Patient did tolerate procedure well. Chest X-ray ordered to verify placement.  CXR: pending.   Joneen Roach, AGACNP-BC [facility] Pulmonology/Critical Care Pager [phone] or [phone]  02/16/2015 6:30 PM   performed with Rob Hickman well Mcarthur Rossetti. Tyson Alias, MD, FACP Pgr: [phone] [facility]

## 2015-02-16 NOTE — Progress Notes (Signed)
OG tube placed. Joneen Roach NP at bedside to visualize film. Orders given to advance tube approximately 5cm with no need for second film. Tube advanced and remains clamped.

## 2015-02-16 NOTE — Progress Notes (Signed)
Pt very SOB called out in distress. Not time for scheduled respiratory treatment.  On provider called and advised to give PRN tx.  Will continue to monitor pt respiratory status.

## 2015-02-16 NOTE — Progress Notes (Signed)
RT in due to patient BiPAP alarming. Patient pulled mask off and is trying to sit on side of bed. HR 118, RR 33, and SATs 87. Patient in distress, minimal air movement, with more slurred speech than earlier today. RN notified. Box called. PRN treatment to be given and ABG.

## 2015-02-16 NOTE — Progress Notes (Addendum)
Pharmacy Antibiotic Note  Joe Gonzalez is a 82 y.o. male admitted on 02/22/2015 with pneumonia.  Pharmacy has been consulted for cefepime dosing.  Plan: Cefepime 2gm IV q24h Monitor plans for narrowing F/u CBC, any cultures, abx LOT  Height:  (170.2 cm) Weight: 161 lb 6 oz (73.2 kg) IBW/kg (Calculated) : 66.1  Temp (24hrs), Avg:97.9 F (36.6 C), Min:97.4 F (36.3 C), Max:98.4 F (36.9 C)   Recent Labs Lab 03/06/2015 1145 02/25/2015 2239 02/15/15 0544  WBC 7.3 7.2 12.7*  CREATININE 1.09 1.49* 1.54*    Estimated Creatinine Clearance: 36.4 mL/min (by C-G formula based on Cr of 1.54).    No Known Allergies  Antimicrobials this admission: Levaquin 2/4 >> 2/6 Azithromycin 2/6 >>  Cefepime 2/6 >>  Microbiology results: 2/6 MRSA PCR: negative  Thank you for allowing pharmacy to be a part of this patient's care.  Reyann Troop L. Roseanne Reno, PharmD PGY2 Infectious Diseases Pharmacy Resident Pager: [phone] 02/16/2015 11:08 AM

## 2015-02-16 NOTE — Progress Notes (Signed)
RT Note:  Rt visited with pt several times in the night for breathing issues.  After scheduled txs, pt becomes more wheezy and distressed initially, then calms a small amount after several minutes.  Pt has audible wheezing which was heard in the hallway.  After auscultation, stridor was noted.  Pt was given a racemic to calm stridor, and pt says he felt some better afterwards.  Pt became more distressed when transfer was starting and was placed on bipap.  Pt traveled well to and was placed in bed.  Pt says he feels some better on bipap but still looks like he is distressed.  Rt gave report to unit therapist and handed off pt.  RT will monitor as needed.

## 2015-02-16 NOTE — Care Management Note (Signed)
Case Management Note  Patient Details  Name: PARAM CAPRI MRN: [id number] Date of Birth: 1933-12-17  Subjective/Objective:    Pt admitted with COPD exacerbation.  Pt was transferred to ICU due to respiratory distress on BIPAP             Action/Plan:  Pt is independent from home with wife, current plan is for pt to discharge back home.  CM will continue to monitor for disposition needs   Expected Discharge Date:                  Expected Discharge Plan:  Home/Self Care  In-House Referral:     Discharge planning Services  CM Consult  Post Acute Care Choice:    Choice offered to:     DME Arranged:    DME Agency:     HH Arranged:    HH Agency:     Status of Service:  In process, will continue to follow  Medicare Important Message Given:    Date Medicare IM Given:    Medicare IM give by:    Date Additional Medicare IM Given:    Additional Medicare Important Message give by:     If discussed at Long Length of Stay Meetings, dates discussed:    Additional Comments:  Cherylann Parr, RN 02/16/2015, 1:46 PM

## 2015-02-16 NOTE — Progress Notes (Signed)
Notified NP T. Claiborne Billings in regards to pt's respiratory status- labored, using accessory muscles, stridor in upper airway, diminished with expiratory wheezes in other lung bases. Asked for CXR, and NP T. Claiborne Billings said will place order. Patient currently on Bipap. Will continue to monitor and assess.

## 2015-02-16 NOTE — Progress Notes (Signed)
PROGRESS NOTE  Joe Gonzalez:[id number] DOB: [birth year] DOA: March 08, 2015 PCP: [name], MD  Brief History 82 y/o male with history of COPD, continued tobacco use, HTN presented with 1 week history of increasing shortness of breath that has worsened over the past 2 days. The patient was recently discharged from the hospital on 02/02/2015 after a one-day stay for COPD exacerbation. The patient was discharged home with a tapering dose of prednisone and azithromycin. The patient stated that he was feeling better, but his dyspnea worsened again when he finished his prednisone taper. He endorses compliance with his Spiriva and Symbicort as well as his albuterol nebulizers. He denied any fevers, chills, chest pain, nausea, vomiting, diarrhea, abdominal pain.  Assessment/Plan: Acute Respiratory Failure with hypoxia -02/16/15 early am--transferred to stepdown secondary to respiratory distress -pt now still having intermitten distress on BiPAP (settings 12/8) -02/16/15 CXR--no infiltrates -repeat ABG--7.28/45/124/21 (0.4 BiPAP) -consult pulmonary--spoke with Dr. Kendrick Fries -question if there is component of R-heart failure/Cor pulmonale contributing to his respiratory status-->echo COPD exacerbation  -Continue intravenous steroids  -Continue bronchodilators  -Continue levofloxacin  -Added budesonide nebulizer 02/15/15 -Pulmonary hygiene  LE Edema -question if there is component of R-heart failure/Cor pulmonale contributing to his respiratory status -echo -LE duplex r/o DVT -daily weight -previous weights approx 145-147 Atypical chest pain -cycle troponins -EKG--LBBB unchanged Hypertension -Continue amlodipine and Cardura -d/c losartan in setting of increasing creatinine Tobacco abuse  -Tobacco cessation discussed  Restless leg syndrome  -Continue Requip  -Check iron studies  LBBB  -unchanged on EKG CKD stage 2-3 -baseline creatinine 1.0-1.3 -dc losartan due to  increasing serum creatinine  Family Communication: Wife update at beside 2/6 Disposition Plan: Home when medically stable    Procedures/Studies: Dg Chest 2 View  March 08, 2015  CLINICAL DATA:  82 y/o male here with c/o SOB since yesterday, this is a chronic condition but was worse yesterday. No other complaints. Hx asthma, COPD, HTN - on meds, current smoker EXAM: CHEST  2 VIEW COMPARISON:  Radiograph 01/22/ 2017 FINDINGS: Normal cardiac silhouette. There is increased atelectasis/ fluid along the horizontal fissure in the RIGHT lung. There is bibasilar linear opacities increased from prior. Mild airspace component on the RIGHT. Lungs are hyperinflated. RIGHT shoulder arthroplasty. IMPRESSION: 1. Increase in bibasilar atelectasis versus infiltrates. 2. New fluid along the RIGHT horizontal fissure. Electronically Signed   By: [name] M.D.   On: 2015-03-08 12:21   Dg Chest Port 1 View  02/16/2015  CLINICAL DATA:  Dyspnea EXAM: PORTABLE CHEST 1 VIEW COMPARISON:  2015-03-08 FINDINGS: Improved aeration in the lung bases with decreased bibasilar atelectasis/ infiltrate. No effusion. Negative for heart failure or edema COPD with hyperinflation IMPRESSION: COPD with improved aeration of the lung bases. Electronically Signed   By: [name] M.D.   On: 02/16/2015 07:01   Dg Chest Portable 1 View  02/02/2015  CLINICAL DATA:  Worsening shortness of breath. Sudden onset 5 hours ago. Chest tightness. Bilateral lower extremity swelling. Wheezing. EXAM: PORTABLE CHEST 1 VIEW COMPARISON:  10/24/2014 FINDINGS: Normal heart size and pulmonary vascularity. Emphysematous changes in the lungs. Linear fibrosis or atelectasis in the mid lungs and lung bases. No focal airspace disease or consolidation. No blunting of costophrenic angles. No pneumothorax. Calcified and tortuous aorta. Postoperative changes in both shoulders. IMPRESSION: Emphysematous changes and scattered fibrosis in the lungs. No evidence of  active pulmonary disease. Electronically Signed   By: [name] M.D.   On: 02/02/2015 00:18  Subjective: Pt c/o dyspnea with cp last night.  No f/c, n/v/d.  No abd pain, dysuria, HA, neck pain.  No f/c  Objective: Filed Vitals:   02/16/15 0721 02/16/15 0800 02/16/15 0810 02/16/15 0900  BP: 145/82 171/79  127/110  Pulse: 106 115  109  Temp:   98 F (36.7 C)   TempSrc:   Axillary   Resp: Height:      Weight:      SpO2: 99% 98%  99%    Intake/Output Summary (Last 24 hours) at 02/16/15 1001 Last data filed at 02/16/15 0200  Gross per 24 hour  Intake    720 ml  Output      0 ml  Net    720 ml   Weight change: 4.928 kg (10 lb 13.8 oz) Exam:   General:  Pt is alert, follows commands appropriately, not in acute distress  HEENT: No icterus, No thrush, No neck mass, [place]/AT  Cardiovascular: RRR, S1/S2, no rubs, no gallops  Respiratory: bibasilar rales with expiratory wheeze  Abdomen: Soft/+BS, non tender, non distended, no guarding; no hepatosplenomegaly  Extremities: 1+ LE edema, No lymphangitis, No petechiae, No rashes, no synovitis;  No cyanosis  Data Reviewed: Basic Metabolic Panel:  Recent Labs Lab Mar 16, 2015 1145 2015/03/16 2239 02/15/15 0544  NA 142  --  142  K 4.2  --  4.8  CL 109  --  109  CO2 25  --  25  GLUCOSE 122*  --  167*  BUN 29*  --  37*  CREATININE 1.09 1.49* 1.54*  CALCIUM 8.6*  --  8.5*  MG  --   --  2.8*  PHOS  --   --  4.5   Liver Function Tests:  Recent Labs Lab 02/15/15 0544  AST 21  ALT 23  ALKPHOS 49  BILITOT 0.5  PROT 4.8*  ALBUMIN 2.9*   No results for input(s): LIPASE, AMYLASE in the last 168 hours. No results for input(s): AMMONIA in the last 168 hours. CBC:  Recent Labs Lab 03-16-2015 1145 03/16/15 2239 02/15/15 0544  WBC 7.3 7.2 12.7*  NEUTROABS 6.5  --  11.9*  HGB 14.4 13.9 12.9*  HCT 44.2 40.9 39.0  MCV 96.9 97.6 98.5  PLT 150 156 143*   Cardiac Enzymes:  Recent Labs Lab  Mar 16, 2015 1145  TROPONINI <0.03   BNP: Invalid input(s): POCBNP CBG:  Recent Labs Lab 02/16/15 0553  GLUCAP 129*    Recent Results (from the past 240 hour(s))  MRSA PCR Screening     Status: None   Collection Time: 02/16/15  6:00 AM  Result Value Ref Range Status   MRSA by PCR NEGATIVE NEGATIVE Final    Comment:        The GeneXpert MRSA Assay (FDA approved for NASAL specimens only), is one component of a comprehensive MRSA colonization surveillance program. It is not intended to diagnose MRSA infection nor to guide or monitor treatment for MRSA infections.      Scheduled Meds: . amLODipine  5 mg Oral Daily  . aspirin EC  81 mg Oral Daily  . budesonide (PULMICORT) nebulizer solution  0.5 mg Nebulization BID  . doxazosin  2 mg Oral Daily  . enoxaparin (LOVENOX) injection  40 mg Subcutaneous Q24H  . guaiFENesin  600 mg Oral BID  . ipratropium-albuterol  3 mL Nebulization Q6H  . levofloxacin (LEVAQUIN) IV  750 mg Intravenous Q24H  . methylPREDNISolone (SOLU-MEDROL) injection  80 mg Intravenous  Q6H  . pantoprazole  40 mg Oral Daily  . rOPINIRole  1 mg Oral BID AC  . sodium chloride flush  3 mL Intravenous Q12H   Continuous Infusions:    Timberlynn Kizziah, DO  [facility] Pager [phone]  If 7PM-7AM, please contact night-coverage [email or web address] Password [email or web address] 02/16/2015, 10:01 AM   LOS: 2 days

## 2015-02-16 NOTE — Progress Notes (Signed)
eLink Physician-Brief Progress Note Patient Name: HISASHI AMADON DOB: 12/31/1933 MRN: [id number]   Date of Service  02/16/2015  HPI/Events of Note  Worsening acidosis.  No air trapping on vent.  eICU Interventions  Will increase RR to 20.     Intervention Category Major Interventions: Other:  Shahzad Thomann 02/16/2015, 7:48 PM

## 2015-02-17 ENCOUNTER — Inpatient Hospital Stay (HOSPITAL_COMMUNITY): Payer: Medicare Other

## 2015-02-17 DIAGNOSIS — I472 Ventricular tachycardia: Secondary | ICD-10-CM

## 2015-02-17 DIAGNOSIS — M79606 Pain in leg, unspecified: Secondary | ICD-10-CM

## 2015-02-17 LAB — BLOOD GAS, ARTERIAL
ACID-BASE DEFICIT: 5.9 mmol/L — AB (ref 0.0–2.0)
Bicarbonate: 20.2 mEq/L (ref 20.0–24.0)
DRAWN BY: 398981
FIO2: 0.4
MECHVT: 530 mL
O2 Saturation: 96.9 %
PEEP/CPAP: 5 cmH2O
PO2 ART: 103 mmHg — AB (ref 80.0–100.0)
Patient temperature: 98.6
RATE: 20 resp/min
TCO2: 21.6 mmol/L (ref 0–100)
pCO2 arterial: 47.6 mmHg — ABNORMAL HIGH (ref 35.0–45.0)
pH, Arterial: 7.25 — ABNORMAL LOW (ref 7.350–7.450)

## 2015-02-17 LAB — BASIC METABOLIC PANEL
ANION GAP: 13 (ref 5–15)
Anion gap: 13 (ref 5–15)
BUN: 73 mg/dL — ABNORMAL HIGH (ref 6–20)
BUN: 89 mg/dL — AB (ref 6–20)
CALCIUM: 8 mg/dL — AB (ref 8.9–10.3)
CALCIUM: 8.3 mg/dL — AB (ref 8.9–10.3)
CHLORIDE: 103 mmol/L (ref 101–111)
CO2: 18 mmol/L — AB (ref 22–32)
CO2: 21 mmol/L — ABNORMAL LOW (ref 22–32)
CREATININE: 2.65 mg/dL — AB (ref 0.61–1.24)
Chloride: 102 mmol/L (ref 101–111)
Creatinine, Ser: 2.96 mg/dL — ABNORMAL HIGH (ref 0.61–1.24)
GFR calc Af Amer: 25 mL/min — ABNORMAL LOW (ref >60)
GFR calc Af Amer: 22 mL/min — ABNORMAL LOW (ref >60)
GFR calc non Af Amer: 21 mL/min — ABNORMAL LOW (ref >60)
GFR, EST NON AFRICAN AMERICAN: 19 mL/min — AB (ref >60)
GLUCOSE: 115 mg/dL — AB (ref 65–99)
Glucose, Bld: 138 mg/dL — ABNORMAL HIGH (ref 65–99)
POTASSIUM: 5.2 mmol/L — AB (ref 3.5–5.1)
Potassium: 5.6 mmol/L — ABNORMAL HIGH (ref 3.5–5.1)
SODIUM: 136 mmol/L (ref 135–145)
Sodium: 134 mmol/L — ABNORMAL LOW (ref 135–145)

## 2015-02-17 LAB — URINALYSIS, ROUTINE W REFLEX MICROSCOPIC
BILIRUBIN URINE: NEGATIVE
GLUCOSE, UA: NEGATIVE mg/dL
HGB URINE DIPSTICK: NEGATIVE
KETONES UR: NEGATIVE mg/dL
Leukocytes, UA: NEGATIVE
Nitrite: NEGATIVE
PROTEIN: NEGATIVE mg/dL
Specific Gravity, Urine: 1.018 (ref 1.005–1.030)
pH: 5 (ref 5.0–8.0)

## 2015-02-17 LAB — SODIUM, URINE, RANDOM: Sodium, Ur: <10 mmol/L

## 2015-02-17 LAB — POCT I-STAT 3, ART BLOOD GAS (G3+)
Acid-base deficit: 6 mmol/L — ABNORMAL HIGH (ref 0.0–2.0)
Bicarbonate: 20.2 mEq/L (ref 20.0–24.0)
O2 SAT: 92 %
PCO2 ART: 40.5 mmHg (ref 35.0–45.0)
PH ART: 7.303 — AB (ref 7.350–7.450)
Patient temperature: 97.8
TCO2: 21 mmol/L (ref 0–100)
pO2, Arterial: 68 mmHg — ABNORMAL LOW (ref 80.0–100.0)

## 2015-02-17 LAB — GLUCOSE, CAPILLARY
GLUCOSE-CAPILLARY: 131 mg/dL — AB (ref 65–99)
GLUCOSE-CAPILLARY: 113 mg/dL — AB (ref 65–99)

## 2015-02-17 LAB — PROCALCITONIN: PROCALCITONIN: 0.27 ng/mL

## 2015-02-17 MED ORDER — VITAL HIGH PROTEIN PO LIQD: 1000.0000 mL | ORAL | Status: DC

## 2015-02-17 MED ORDER — CALCIUM GLUCONATE 10 % IV SOLN
1.0000 g | Freq: Once | INTRAVENOUS | Status: AC
  Administered 2015-02-17: 1 g via INTRAVENOUS
  Filled 2015-02-17: qty 10

## 2015-02-17 MED ORDER — SODIUM POLYSTYRENE SULFONATE 15 GM/60ML PO SUSP
15.0000 g | Freq: Once | ORAL | Status: AC
  Administered 2015-02-17: 15 g via ORAL
  Filled 2015-02-17: qty 60

## 2015-02-17 MED ORDER — IPRATROPIUM BROMIDE 0.02 % IN SOLN
0.5000 mg | RESPIRATORY_TRACT | Status: DC
Start: 2015-02-17 — End: 2015-02-22
  Administered 2015-02-17 – 2015-02-21 (×25): 0.5 mg via RESPIRATORY_TRACT
  Filled 2015-02-17 (×25): qty 2.5

## 2015-02-17 MED ORDER — LEVALBUTEROL HCL 1.25 MG/0.5ML IN NEBU: 0.6300 mg | INHALATION_SOLUTION | RESPIRATORY_TRACT | Status: DC | PRN

## 2015-02-17 MED ORDER — PRO-STAT SUGAR FREE PO LIQD
30.0000 mL | Freq: Two times a day (BID) | ORAL | Status: DC
  Administered 2015-02-17 – 2015-02-21 (×9): 30 mL
  Filled 2015-02-17 (×10): qty 30

## 2015-02-17 MED ORDER — PANTOPRAZOLE SODIUM 40 MG PO PACK
40.0000 mg | PACK | Freq: Every day | ORAL | Status: DC
  Administered 2015-02-17 – 2015-02-21 (×5): 40 mg
  Filled 2015-02-17 (×5): qty 20

## 2015-02-17 MED ORDER — ASPIRIN 81 MG PO CHEW
81.0000 mg | CHEWABLE_TABLET | Freq: Every day | ORAL | Status: DC
  Administered 2015-02-17 – 2015-02-21 (×5): 81 mg
  Filled 2015-02-17 (×5): qty 1

## 2015-02-17 MED ORDER — LEVALBUTEROL HCL 0.63 MG/3ML IN NEBU
0.6300 mg | INHALATION_SOLUTION | RESPIRATORY_TRACT | Status: DC
  Administered 2015-02-17 – 2015-02-21 (×25): 0.63 mg via RESPIRATORY_TRACT
  Filled 2015-02-17 (×26): qty 3
  Filled 2015-02-17: qty 0.26
  Filled 2015-02-17 (×4): qty 3
  Filled 2015-02-17: qty 0.26
  Filled 2015-02-17 (×7): qty 3
  Filled 2015-02-17: qty 0.26
  Filled 2015-02-17 (×6): qty 3
  Filled 2015-02-17: qty 0.26
  Filled 2015-02-17: qty 3
  Filled 2015-02-17: qty 0.26
  Filled 2015-02-17 (×4): qty 3

## 2015-02-17 MED ORDER — DEXTROSE 5 % IV SOLN
1.0000 g | INTRAVENOUS | Status: DC
  Filled 2015-02-17: qty 1

## 2015-02-17 MED ORDER — VITAL HIGH PROTEIN PO LIQD
1000.0000 mL | ORAL | Status: DC
  Administered 2015-02-17: 1000 mL
  Administered 2015-02-17: 19:00:00
  Administered 2015-02-18: 1000 mL
  Administered 2015-02-18 (×2)
  Administered 2015-02-19 – 2015-02-20 (×2): 1000 mL
  Filled 2015-02-17 (×2): qty 1000

## 2015-02-17 MED ORDER — LEVALBUTEROL HCL 1.25 MG/0.5ML IN NEBU
0.6300 mg | INHALATION_SOLUTION | RESPIRATORY_TRACT | Status: DC
  Filled 2015-02-17 (×5): qty 0.26

## 2015-02-17 NOTE — Progress Notes (Signed)
Initial Nutrition Assessment  DOCUMENTATION CODES:   Not applicable  INTERVENTION:    Initiate TF via OGT with Vital High Protein at 25 ml/h and Prostat 30 ml BID on day 1; on day 2, increase to goal rate of 35 ml/h (840 ml per day) to provide 1040 kcals, 104 gm protein, 702 ml free water daily.  NUTRITION DIAGNOSIS:   Inadequate oral intake related to inability to eat as evidenced by NPO status.  GOAL:   Patient will meet greater than or equal to 90% of their needs  MONITOR:   Vent status, Labs, Weight trends, TF tolerance, I & O's  REASON FOR ASSESSMENT:   Rounds (Start TF per discussion with Dr. Isaiah Serge)    ASSESSMENT:   82 year old male with a past medical history significant for severe COPD who has had multiple recurrent admissions since October 2016 who presented to the [facility] [place] emergency department on 2015/03/08 complaining of shortness of breath.   Labs reviewed: sodium low, potassium elevated. Discussed patient with Dr. Isaiah Serge and RN; RD to order TF today.  Patient is currently intubated on ventilator support MV: 9.7 L/min Temp (24hrs), Avg:98.3 F (36.8 C), Min:97.8 F (36.6 C), Max:98.5 F (36.9 C)  Propofol: 19.8 ml/hr providing 523 kcals per day.  Diet Order:  Diet NPO time specified  Skin:  Reviewed, no issues  Last BM:  2/5  Height:   Ht Readings from Last 1 Encounters:  2015/03/08  (1.702 m)    Weight:   Wt Readings from Last 1 Encounters:  02/17/15 158 lb 15.2 oz (72.1 kg)    Ideal Body Weight:  67.3 kg  BMI:  Body mass index is 24.89 kg/(m^2).  Estimated Nutritional Needs:   Kcal:  1595  Protein:  100-115 gm  Fluid:  1.8 L  EDUCATION NEEDS:   No education needs identified at this time  Joaquin Courts, RD, LDN, CNSC Pager [phone] After Hours Pager [phone]

## 2015-02-17 NOTE — Progress Notes (Signed)
PULMONARY / CRITICAL CARE MEDICINE   Name: Joe Gonzalez MRN: [id number] DOB: 1933-01-23    ADMISSION DATE:  Mar 11, 2015 CONSULTATION DATE:  02/16/2015  REFERRING MD:  Dr. Arbutus Leas with [facility]  CHIEF COMPLAINT:  Shortness of breath  HISTORY OF PRESENT ILLNESS:   This is a 82 year old male with a past medical history significant for severe COPD who has had multiple recurrent admissions since October 2016 who presented to the [facility] [place] emergency department on March 11, 2015 complaining of shortness of breath. He tells me that he had been in his usual state of health but starting Friday morning he woke up with shortness of breath. He did not have mucus production but he has had a cough and wheezing. He reports that he has been compliant with his Symbicort and Spiriva. He also notes that he continues to smoke cigarettes. He attributes his lung problems to dust and "fume" exposure over the years. He says that he has not had a fever, chills, headache, or body aches. He denies any sort of sick contacts recently. According to both he and his wife he says that he never produces any mucus when he gets sick. He has noted some leg swelling in his right leg recently. He denies chest pain. He was admitted to our hospital on Mar 11, 2015 and was treated with bronchodilators as well as steroids. He became more short of breath overnight and was moved to the intensive care unit early this morning on 02/16/2015. He does note that after he received bronchodilator treatment early in the morning he felt that this made his breathing worse. Nursing notes that after his DuoNeb treatment this morning he did have a significant episode of tachycardia and tachypnea. He says that he is now feeling a little bit better.  PAST MEDICAL HISTORY :  He  has a past medical history of COPD (chronic obstructive pulmonary disease) (HCC); Hypertension; Asthma; and Restless leg syndrome.  PAST SURGICAL HISTORY: He  has past surgical  history that includes Hernia repair; Back surgery; Rotator cuff repair; and Knee Arthroplasty.  No Known Allergies  No current facility-administered medications on file prior to encounter.   Current Outpatient Prescriptions on File Prior to Encounter  Medication Sig  . amLODipine (NORVASC) 5 MG tablet Take 5 mg by mouth daily.  Marland Kitchen aspirin EC 81 MG EC tablet Take 1 tablet (81 mg total) by mouth daily.  . budesonide-formoterol (SYMBICORT) 160-4.5 MCG/ACT inhaler Take 2 puffs first thing in am and then another 2 puffs about 12 hours later. (Patient taking differently: Inhale 2 puffs into the lungs every 12 (twelve) hours. )  . clorazepate (TRANXENE) 3.75 MG tablet Take 3.75 mg by mouth daily as needed for anxiety.  Marland Kitchen doxazosin (CARDURA) 2 MG tablet Take 2 mg by mouth daily.  . furosemide (LASIX) 20 MG tablet Take 20 mg by mouth daily as needed for fluid (ankle swelling).   Marland Kitchen guaiFENesin (MUCINEX) 600 MG 12 hr tablet Take 1 tablet (600 mg total) by mouth 2 (two) times daily.  Marland Kitchen ipratropium-albuterol (DUONEB) 0.5-2.5 (3) MG/3ML SOLN Take 3 mLs by nebulization every 6 (six) hours as needed (wheezing).  Marland Kitchen losartan (COZAAR) 100 MG tablet Take 100 mg by mouth daily.  . potassium chloride SA (K-DUR,KLOR-CON) 20 MEQ tablet Take 10 mEq by mouth daily as needed (take with furosemide dose).   . predniSONE (DELTASONE) 10 MG tablet Take  4 each am x 2 days,  3 each am x 2 days,  2 each am x  2 days, 1 each am x 2 days  ,then  Stop. (Patient taking differently: Take 10-40 mg by mouth daily with breakfast. Tapered course prescribed 02/02/15: take 4 tablets (40 mg) by mouth daily for 2 days, then take 3 tablets (30 mg) daily for 2 days, then take 2 tablets (20 mg) daily for 2 days, then take 1 tablet  (10 mg) daily for 2 days, then stop)  . Tiotropium Bromide Monohydrate (SPIRIVA RESPIMAT) 2.5 MCG/ACT AERS 2 pffs each am (Patient taking differently: Inhale 2 puffs into the lungs daily. 2 pffs each a)  . traMADol  (ULTRAM) 50 MG tablet Take 50 mg by mouth 3 (three) times daily as needed for moderate pain.     FAMILY HISTORY:  He did not report a family history of lung disease  SOCIAL HISTORY: He  reports that he has been smoking Cigarettes.  He has a 10 pack-year smoking history. He has never used smokeless tobacco. He reports that he does not drink alcohol or use illicit drugs.  REVIEW OF SYSTEMS:   Could not obtain full review of systems secondary to using BiPAP.  SUBJECTIVE:  Intubated overnight due to worsening respiratory status. Full vent support.  Foley Catheter for urinary retention overnight.   VITAL SIGNS: BP 105/59 mmHg  Pulse 82  Temp(Src) 98.4 F (36.9 C) (Oral)  Resp 20  Ht  (1.702 m)  Wt 158 lb 15.2 oz (72.1 kg)  BMI 24.89 kg/m2  SpO2 99%  HEMODYNAMICS:    VENTILATOR SETTINGS: Vent Mode:  [-] PRVC FiO2 (%):  [40 %] 40 % Set Rate:  [16 bmp-20 bmp] 20 bmp Vt Set:  [530 mL] 530 mL Pressure Support:  [5 cmH20] 5 cmH20 Plateau Pressure:  [17 cmH20-26 cmH20] 26 cmH20  INTAKE / OUTPUT: I/O last 3 completed shifts: In: 390.1 [I.V.:140.1; IV Piggyback:250] Out: 1025 [Urine:750; Emesis/NG output:275]  PHYSICAL EXAMINATION: General:  Elderly male, tachypneic on BiPAP, some accessory muscle use Neuro:  Awake alert, oriented 4, moves all 4 extremities HEENT:  Normocephalic atraumatic, BiPAP mask in place, extraocular movements intact Cardiovascular:  Tachycardic but regular, distant heart sounds, no clear murmurs, Lungs:  Very poor air movement, some wheezing bilaterally, increased respiratory effort Abdomen:  Bowel sounds are positive, nontender nondistended Musculoskeletal:  Normal bulk and tone Skin:  Thin skin bilaterally with some bruising, no cyanosis  LABS:  BMET  Recent Labs Lab 02/15/15 0544 02/16/15 1112 02/17/15 0230  NA 142 140 134*  K 4.8 5.4* 5.6*  CL 109 105 103  CO2 25 25 18*  BUN 37* 50* 73*  CREATININE 1.54* 1.91* 2.65*  GLUCOSE 167*  124* 115*    Electrolytes  Recent Labs Lab 02/15/15 0544 02/16/15 1112 02/17/15 0230  CALCIUM 8.5* 8.7* 8.0*  MG 2.8*  --   --   PHOS 4.5  --   --     CBC  Recent Labs Lab 02-28-15 2239 02/15/15 0544 02/16/15 1112  WBC 7.2 12.7* 19.0*  HGB 13.9 12.9* 14.1  HCT 40.9 39.0 42.9  PLT 156 143* 142*    Coag's No results for input(s): APTT, INR in the last 168 hours.  Sepsis Markers No results for input(s): LATICACIDVEN, PROCALCITON, O2SATVEN in the last 168 hours.  ABG  Recent Labs Lab 02/16/15 1752 02/16/15 1942 02/16/15 2128  PHART 7.203* 7.170* 7.205*  PCO2ART 55.4* 54.5* 52.8*  PO2ART 126.0* 129.0* 94.0    Liver Enzymes  Recent Labs Lab 02/15/15 0544  AST 21  ALT 23  ALKPHOS 49  BILITOT 0.5  ALBUMIN 2.9*    Cardiac Enzymes  Recent Labs Lab 03-12-2015 1145 02/16/15 1112 02/16/15 1541  TROPONINI <0.03 0.04* 0.05*    Glucose  Recent Labs Lab 02/16/15 0553  GLUCAP 129*    Imaging 02/16/2015 chest x-ray images personally reviewed showing a questionable right lower lobe infiltrate which is new compared to the images from 02/01/2015 there is also significant hyperinflation consistent with emphysema  STUDIES:  02/16/2015 echocardiogram pending 02/16/2015 lower extremities Doppler pending  CULTURES: None  ANTIBIOTICS: 02/16/2015 cefepime 02/16/2015 azithromycin  SIGNIFICANT EVENTS: Minerva Areola fourth 2017 admission 02/16/2015 moved to ICU for BiPAP  LINES/TUBES: February 16 2015 BiPAP  DISCUSSION: This is a 82 year old male who has gold stage II airflow obstruction but Gold grade D COPD based on multiple recurrent exacerbations and severe symptoms who continues to smoke cigarettes who is now in [facility] with acute on chronic hypoxemic respiratory failure secondary to a COPD exacerbation. He has a questionable right lung infiltrate with a slightly elevated white blood cell count in the setting of steroid use. Is not clear to me  that he has pneumonia but considering the fact that his respiratory status has worsened think he needs anabolic coverage for now. He has recurrent COPD exacerbations because he continues to smoke cigarettes. This is explained to him at length today. I don't believe he thinks that cigarettes are actually causing a problem with his health. He is currently high risk for intubation.  Notably, he has had some leg swelling. Sometimes pulmonary embolism can cause an acute exacerbation of COPD. He is certainly at risk for PE considering his multiple recent exacerbations.  ASSESSMENT / PLAN:  PULMONARY A: Acute respiratory failure with hypoxemia Acute exacerbation of COPD Active tobacco use Questionable healthcare associated pneumonia Intolerance of nebulized med? (unclear, became more dyspneic and tachycardic after duoneb and pulmicort) Respiratory Failure Requiring Intubation P:   Continue solu-medrol.  Cefepime 2/7 >  Consider Vanc  VAP prevention Full vent support Consider adjustment of Inhaler regimen once extubated.   CARDIOVASCULAR A:  Sinus tachycardia Elevated JVD, questionable cor pulmonale Leg swelling, differential diagnosis includes edema versus like clot P:  Follow-up echocardiogram Telemetry monitoring  Check d-dimer, if elevated would consider empiric anticoagulation until DVT or pulmonary embolism could be ruled out   RENAL A:   Worsening renal function, unclear cause Urinary Retention P:   Check urinalysis and Urine Cr and Urine sodium Acute urinary retention likely contributing. Foley placed.  Monitor BMET and UOP Replace electrolytes as needed  GASTROINTESTINAL A:   No acute issues  P:   Nothing by mouth while on BiPAP  HEMATOLOGIC A:   No acute issues P:  Monitor for bleeding  INFECTIOUS A:   Questionable healthcare associated pneumonia with worsening leukocytosis (the rising white count could be due to steroids) P:   Cefepime and azithromycin  for now Consider broadening to Vanc / Cefepime.  Pct algorithm  ENDOCRINE A:   No acute issues   P:   Monitor glucose with lab work  NEUROLOGIC A:   No acute issues P:   Sedated on vent Rass goal -2 Watch closely.    FAMILY  - Updates: Wife updated at bedside by me on 02/16/2015  - Inter-disciplinary family meet or Palliative Care meeting due by:  Day 7   Devota Pace, MD Family Medicine - PGY 2

## 2015-02-17 NOTE — Progress Notes (Signed)
ETT retracted 2cm per MD order. Secured at 22 at lips.

## 2015-02-17 NOTE — Progress Notes (Signed)
No urine output since condom cath placement. Bladder scan shows >200 mL. Inserting foley per MD Doroteo Glassman.

## 2015-02-17 NOTE — Progress Notes (Signed)
*  PRELIMINARY RESULTS* Vascular Ultrasound Lower extremity venous duplex has been completed.  Preliminary findings: no evidence of DVT. Right baker's cyst noted.  Farrel Demark, RDMS, RVT  02/17/2015, 10:17 AM

## 2015-02-17 NOTE — Progress Notes (Signed)
Pharmacy Antibiotic Note  Joe Gonzalez is a 82 y.o. male admitted on 03/04/15 with pneumonia.  Pharmacy has been consulted for Cefepime and Azithromycin dosing.  Plan: The dose of Cefepime will be adjusted to 1g IV every 24 hours based on renal function. Continue Azithromycin  IV every 24 hours.  Height:  (170.2 cm) Weight: 158 lb 15.2 oz (72.1 kg) IBW/kg (Calculated) : 66.1  Temp (24hrs), Avg:98.3 F (36.8 C), Min:97.8 F (36.6 C), Max:98.5 F (36.9 C)   Recent Labs Lab 03-04-15 1145 03/04/15 2239 02/15/15 0544 02/16/15 1112 02/17/15 0230  WBC 7.3 7.2 12.7* 19.0*  --   CREATININE 1.09 1.49* 1.54* 1.91* 2.65*    Estimated Creatinine Clearance: 21.1 mL/min (by C-G formula based on Cr of 2.65).    No Known Allergies  Antimicrobials this admission: Levaquin 2/4 >> 2/6 Azithromycin 2/6 >> Cefepime 2/6 >>  Dose adjustments this admission: none  Microbiology results: 2/6 MRSA PCR: negative  Thank you for allowing pharmacy to be a part of this patient's care.  Link Snuffer, PharmD, BCPS Clinical Pharmacist [phone]  02/17/2015 1:56 PM

## 2015-02-17 NOTE — Progress Notes (Signed)
Echocardiogram 2D Echocardiogram has been performed.  Joe Gonzalez 02/17/2015, 4:31 PM

## 2015-02-18 ENCOUNTER — Inpatient Hospital Stay (HOSPITAL_COMMUNITY): Payer: Medicare Other

## 2015-02-18 LAB — POCT I-STAT 3, ART BLOOD GAS (G3+)
Acid-base deficit: 6 mmol/L — ABNORMAL HIGH (ref 0.0–2.0)
Bicarbonate: 21.7 mEq/L (ref 20.0–24.0)
O2 SAT: 88 %
PCO2 ART: 51.2 mmHg — AB (ref 35.0–45.0)
PH ART: 7.234 — AB (ref 7.350–7.450)
Patient temperature: 98.2
TCO2: 23 mmol/L (ref 0–100)
pO2, Arterial: 65 mmHg — ABNORMAL LOW (ref 80.0–100.0)

## 2015-02-18 LAB — TROPONIN I
TROPONIN I: 0.03 ng/mL (ref <0.031)
TROPONIN I: 0.05 ng/mL — AB (ref <0.031)
Troponin I: <0.03 ng/mL (ref <0.031)

## 2015-02-18 LAB — BASIC METABOLIC PANEL
ANION GAP: 15 (ref 5–15)
Anion gap: 14 (ref 5–15)
BUN: 103 mg/dL — AB (ref 6–20)
BUN: 122 mg/dL — AB (ref 6–20)
CHLORIDE: 102 mmol/L (ref 101–111)
CO2: 21 mmol/L — ABNORMAL LOW (ref 22–32)
CO2: 19 mmol/L — ABNORMAL LOW (ref 22–32)
CREATININE: 3.38 mg/dL — AB (ref 0.61–1.24)
Calcium: 8.2 mg/dL — ABNORMAL LOW (ref 8.9–10.3)
Calcium: 7.5 mg/dL — ABNORMAL LOW (ref 8.9–10.3)
Chloride: 104 mmol/L (ref 101–111)
Creatinine, Ser: 3.27 mg/dL — ABNORMAL HIGH (ref 0.61–1.24)
GFR, EST AFRICAN AMERICAN: 19 mL/min — AB (ref >60)
GFR, EST AFRICAN AMERICAN: 18 mL/min — AB (ref >60)
GFR, EST NON AFRICAN AMERICAN: 17 mL/min — AB (ref >60)
GFR, EST NON AFRICAN AMERICAN: 16 mL/min — AB (ref >60)
Glucose, Bld: 141 mg/dL — ABNORMAL HIGH (ref 65–99)
Glucose, Bld: 155 mg/dL — ABNORMAL HIGH (ref 65–99)
POTASSIUM: 5.3 mmol/L — AB (ref 3.5–5.1)
POTASSIUM: 4.8 mmol/L (ref 3.5–5.1)
SODIUM: 138 mmol/L (ref 135–145)
SODIUM: 137 mmol/L (ref 135–145)

## 2015-02-18 LAB — MAGNESIUM: Magnesium: 2.7 mg/dL — ABNORMAL HIGH (ref 1.7–2.4)

## 2015-02-18 LAB — CBC
HCT: 38.3 % — ABNORMAL LOW (ref 39.0–52.0)
HEMOGLOBIN: 12.7 g/dL — AB (ref 13.0–17.0)
MCH: 32.6 pg (ref 26.0–34.0)
MCHC: 33.2 g/dL (ref 30.0–36.0)
MCV: 98.2 fL (ref 78.0–100.0)
PLATELETS: 101 10*3/uL — AB (ref 150–400)
RBC: 3.9 MIL/uL — AB (ref 4.22–5.81)
RDW: 14.8 % (ref 11.5–15.5)
WBC: 13.1 10*3/uL — ABNORMAL HIGH (ref 4.0–10.5)

## 2015-02-18 LAB — GLUCOSE, CAPILLARY
GLUCOSE-CAPILLARY: 128 mg/dL — AB (ref 65–99)
GLUCOSE-CAPILLARY: 140 mg/dL — AB (ref 65–99)
GLUCOSE-CAPILLARY: 152 mg/dL — AB (ref 65–99)
Glucose-Capillary: 134 mg/dL — ABNORMAL HIGH (ref 65–99)
Glucose-Capillary: 141 mg/dL — ABNORMAL HIGH (ref 65–99)

## 2015-02-18 LAB — PROCALCITONIN: Procalcitonin: 0.28 ng/mL

## 2015-02-18 LAB — SODIUM, URINE, RANDOM

## 2015-02-18 LAB — CREATININE, URINE, RANDOM: CREATININE, URINE: 107.17 mg/dL

## 2015-02-18 LAB — CALCIUM, URINE, RANDOM: CALCIUM UR: 4.6 mg/dL

## 2015-02-18 LAB — PHOSPHORUS: Phosphorus: 9 mg/dL — ABNORMAL HIGH (ref 2.5–4.6)

## 2015-02-18 MED ORDER — SODIUM CHLORIDE 0.9 % IV SOLN
25.0000 ug/h | INTRAVENOUS | Status: DC
  Administered 2015-02-18: 25 ug/h via INTRAVENOUS
  Administered 2015-02-19: 75 ug/h via INTRAVENOUS
  Administered 2015-02-21: 25 ug/h via INTRAVENOUS
  Filled 2015-02-18 (×2): qty 50

## 2015-02-18 MED ORDER — SODIUM CHLORIDE 0.9 % IV BOLUS (SEPSIS)
500.0000 mL | Freq: Once | INTRAVENOUS | Status: AC
  Administered 2015-02-18: 500 mL via INTRAVENOUS

## 2015-02-18 MED ORDER — FENTANYL CITRATE (PF) 100 MCG/2ML IJ SOLN
50.0000 ug | Freq: Once | INTRAMUSCULAR | Status: AC
  Administered 2015-02-18: 50 ug via INTRAVENOUS

## 2015-02-18 MED ORDER — SODIUM POLYSTYRENE SULFONATE 15 GM/60ML PO SUSP
15.0000 g | Freq: Once | ORAL | Status: AC
Start: 2015-02-18 — End: 2015-02-18
  Administered 2015-02-18: 15 g via ORAL
  Filled 2015-02-18: qty 60

## 2015-02-18 MED ORDER — DEXTROSE 5 % IV SOLN
500.0000 mg | INTRAVENOUS | Status: DC
  Administered 2015-02-18 – 2015-02-21 (×4): 500 mg via INTRAVENOUS
  Filled 2015-02-18 (×4): qty 0.5

## 2015-02-18 MED ORDER — METHYLPREDNISOLONE SODIUM SUCC 125 MG IJ SOLR
80.0000 mg | Freq: Two times a day (BID) | INTRAMUSCULAR | Status: DC
  Administered 2015-02-18: 80 mg via INTRAVENOUS
  Filled 2015-02-18 (×2): qty 1.28

## 2015-02-18 MED ORDER — FENTANYL BOLUS VIA INFUSION
25.0000 ug | INTRAVENOUS | Status: DC | PRN
  Administered 2015-02-20: 25 ug via INTRAVENOUS
  Administered 2015-02-20: 50 ug via INTRAVENOUS
  Filled 2015-02-18: qty 25

## 2015-02-18 MED ORDER — SODIUM CHLORIDE 0.9 % IV SOLN
INTRAVENOUS | Status: DC
  Administered 2015-02-18 – 2015-02-19 (×3): via INTRAVENOUS
  Administered 2015-02-19: 100 mL/h via INTRAVENOUS
  Administered 2015-02-20 – 2015-02-21 (×3): via INTRAVENOUS

## 2015-02-18 NOTE — Progress Notes (Signed)
Pharmacy Antibiotic Note  Joe Gonzalez is a 82 y.o. male admitted on 02/13/2015 with pneumonia and sepsis.  Pharmacy has been consulted for cefepime dosing.  Plan: The dose of cefepime 1 gm IV q24h will be adjusted to cefepime 500 mg IV q24h based on renal function.  Height:  (170.2 cm) Weight: 156 lb 8.4 oz (71 kg) IBW/kg (Calculated) : 66.1  Temp (24hrs), Avg:98.3 F (36.8 C), Min:97.9 F (36.6 C), Max:98.5 F (36.9 C)   Recent Labs Lab 02/12/2015 1145 03/09/2015 2239 02/15/15 0544 02/16/15 1112 02/17/15 0230 02/17/15 1909 02/18/15 0250  WBC 7.3 7.2 12.7* 19.0*  --   --  13.1*  CREATININE 1.09 1.49* 1.54* 1.91* 2.65* 2.96* 3.27*    Estimated Creatinine Clearance: 17.1 mL/min (by C-G formula based on Cr of 3.27).    No Known Allergies  Antimicrobials this admission: Levaquin 2/4 >> 2/6 Azithromycin 2/6 >> Cefepime 2/6 >>  Dose adjustments this admission: none  Microbiology results: MRSA PCR neg  Thank you for allowing pharmacy to be a part of this patient's care.  Damoni Erker L. Roseanne Reno, PharmD PGY2 Infectious Diseases Pharmacy Resident Pager: [phone] 02/18/2015 9:21 AM

## 2015-02-18 NOTE — Progress Notes (Signed)
PULMONARY / CRITICAL CARE MEDICINE   Name: Joe Gonzalez MRN: [id number] DOB: 1933/07/06    ADMISSION DATE:  02/24/2015 CONSULTATION DATE:  02/16/2015  REFERRING MD:  Dr. Arbutus Leas with [facility]  CHIEF COMPLAINT:  Shortness of breath  HISTORY OF PRESENT ILLNESS:   This is a 82 year old male with a past medical history significant for severe COPD who has had multiple recurrent admissions since October 2016 who presented to the [facility] emergency department on 02/13/2015 complaining of shortness of breath. He tells me that he had been in his usual state of health but starting Friday morning he woke up with shortness of breath. He did not have mucus production but he has had a cough and wheezing. He reports that he has been compliant with his Symbicort and Spiriva. He also notes that he continues to smoke cigarettes. He attributes his lung problems to dust and "fume" exposure over the years. He says that he has not had a fever, chills, headache, or body aches. He denies any sort of sick contacts recently. According to both he and his wife he says that he never produces any mucus when he gets sick. He has noted some leg swelling in his right leg recently. He denies chest pain. He was admitted to our hospital on 03/07/2015 and was treated with bronchodilators as well as steroids. He became more short of breath overnight and was moved to the intensive care unit early this morning on 02/16/2015. He does note that after he received bronchodilator treatment early in the morning he felt that this made his breathing worse. Nursing notes that after his DuoNeb treatment this morning he did have a significant episode of tachycardia and tachypnea. He says that he is now feeling a little bit better.  PAST MEDICAL HISTORY :  He  has a past medical history of COPD (chronic obstructive pulmonary disease) (HCC); Hypertension; Asthma; and Restless leg syndrome.  PAST SURGICAL HISTORY: He  has past surgical  history that includes Hernia repair; Back surgery; Rotator cuff repair; and Knee Arthroplasty.  No Known Allergies  No current facility-administered medications on file prior to encounter.   Current Outpatient Prescriptions on File Prior to Encounter  Medication Sig  . amLODipine (NORVASC) 5 MG tablet Take 5 mg by mouth daily.  Marland Kitchen aspirin EC 81 MG EC tablet Take 1 tablet (81 mg total) by mouth daily.  . budesonide-formoterol (SYMBICORT) 160-4.5 MCG/ACT inhaler Take 2 puffs first thing in am and then another 2 puffs about 12 hours later. (Patient taking differently: Inhale 2 puffs into the lungs every 12 (twelve) hours. )  . clorazepate (TRANXENE) 3.75 MG tablet Take 3.75 mg by mouth daily as needed for anxiety.  Marland Kitchen doxazosin (CARDURA) 2 MG tablet Take 2 mg by mouth daily.  . furosemide (LASIX) 20 MG tablet Take 20 mg by mouth daily as needed for fluid (ankle swelling).   Marland Kitchen guaiFENesin (MUCINEX) 600 MG 12 hr tablet Take 1 tablet (600 mg total) by mouth 2 (two) times daily.  Marland Kitchen ipratropium-albuterol (DUONEB) 0.5-2.5 (3) MG/3ML SOLN Take 3 mLs by nebulization every 6 (six) hours as needed (wheezing).  Marland Kitchen losartan (COZAAR) 100 MG tablet Take 100 mg by mouth daily.  . potassium chloride SA (K-DUR,KLOR-CON) 20 MEQ tablet Take 10 mEq by mouth daily as needed (take with furosemide dose).   . predniSONE (DELTASONE) 10 MG tablet Take  4 each am x 2 days,  3 each am x 2 days,  2 each am x  2 days, 1 each am x 2 days  ,then  Stop. (Patient taking differently: Take 10-40 mg by mouth daily with breakfast. Tapered course prescribed 02/02/15: take 4 tablets (40 mg) by mouth daily for 2 days, then take 3 tablets (30 mg) daily for 2 days, then take 2 tablets (20 mg) daily for 2 days, then take 1 tablet  (10 mg) daily for 2 days, then stop)  . Tiotropium Bromide Monohydrate (SPIRIVA RESPIMAT) 2.5 MCG/ACT AERS 2 pffs each am (Patient taking differently: Inhale 2 puffs into the lungs daily. 2 pffs each a)  . traMADol  (ULTRAM) 50 MG tablet Take 50 mg by mouth 3 (three) times daily as needed for moderate pain.     FAMILY HISTORY:  He did not report a family history of lung disease  SOCIAL HISTORY: He  reports that he has been smoking Cigarettes.  He has a 10 pack-year smoking history. He has never used smokeless tobacco. He reports that he does not drink alcohol or use illicit drugs.  REVIEW OF SYSTEMS:   Could not obtain full review of systems secondary to using BiPAP.  SUBJECTIVE:  Tolerating Ventilation Echo without signs of RV strain No evidence of DVT Continue to treat for COPD exacerbation.   VITAL SIGNS: BP 118/72 mmHg  Pulse 95  Temp(Src) 98.3 F (36.8 C) (Oral)  Resp 18  Ht 5\' 7"  (1.702 m)  Wt 156 lb 8.4 oz (71 kg)  BMI 24.51 kg/m2  SpO2 100%  HEMODYNAMICS:    VENTILATOR SETTINGS: Vent Mode:  [-] PRVC FiO2 (%):  [40 %] 40 % Set Rate:  [20 bmp] 20 bmp Vt Set:  [530 mL] 530 mL PEEP:  [5 cmH20] 5 cmH20 Plateau Pressure:  [22 cmH20-43 cmH20] 22 cmH20  INTAKE / OUTPUT: I/O last 3 completed shifts: In: 1399.3 [I.V.:604.3; Other:110; NG/GT:275; IV Piggyback:410] Out: 1860 [Urine:1160; Emesis/NG output:700]  PHYSICAL EXAMINATION: General:  Elderly male, tachypneic intubated, improved accessory muscle use Neuro:  Awake alert, moves all 4 extremities HEENT:  Normocephalic atraumatic, EOMI Cardiovascular:  Tachycardic but regular, distant heart sounds, no clear murmurs, Lungs:  Very poor air movement, some mild wheezing bilaterally, increased respiratory effort though improved overall.  Abdomen:  Bowel sounds are positive, nontender nondistended Musculoskeletal:  Normal bulk and tone Skin:  Thin skin bilaterally with some bruising, no cyanosis  LABS:  BMET  Recent Labs Lab 02/17/15 0230 02/17/15 1909 02/18/15 0250  NA 134* 136 138  K 5.6* 5.2* 5.3*  CL 103 102 102  CO2 18* 21* 21*  BUN 73* 89* 103*  CREATININE 2.65* 2.96* 3.27*  GLUCOSE 115* 138* 141*     Electrolytes  Recent Labs Lab 02/15/15 0544  02/17/15 0230 02/17/15 1909 02/18/15 0250  CALCIUM 8.5*  < > 8.0* 8.3* 8.2*  MG 2.8*  --   --   --   --   PHOS 4.5  --   --   --   --   < > = values in this interval not displayed.  CBC  Recent Labs Lab 02/15/15 0544 02/16/15 1112 02/18/15 0250  WBC 12.7* 19.0* 13.1*  HGB 12.9* 14.1 12.7*  HCT 39.0 42.9 38.3*  PLT 143* 142* 101*    Coag's No results for input(s): APTT, INR in the last 168 hours.  Sepsis Markers  Recent Labs Lab 02/17/15 0230 02/18/15 0250  PROCALCITON 0.27 0.28    ABG  Recent Labs Lab 02/16/15 2128 02/17/15 0849 02/17/15 2120  PHART 7.205* 7.303* 7.250*  PCO2ART  52.8* 40.5 47.6*  PO2ART 94.0 68.0* 103*    Liver Enzymes  Recent Labs Lab 02/15/15 0544  AST 21  ALT 23  ALKPHOS 49  BILITOT 0.5  ALBUMIN 2.9*    Cardiac Enzymes  Recent Labs Lab 02/26/15 1145 02/16/15 1112 02/16/15 1541  TROPONINI <0.03 0.04* 0.05*    Glucose  Recent Labs Lab 02/16/15 0553 02/17/15 1532 02/17/15 1952  GLUCAP 129* 131* 113*    Imaging 02/16/2015 chest x-ray images personally reviewed showing a questionable right lower lobe infiltrate which is new compared to the images from 02/01/2015 there is also significant hyperinflation consistent with emphysema  STUDIES:  02/16/2015 echocardiogram preserved ejection fraction, pulmonary hypertension.  02/16/2015 lower extremities Doppler negative.   CULTURES: None  ANTIBIOTICS: 02/16/2015 cefepime 02/16/2015 azithromycin  SIGNIFICANT EVENTS: Minerva Areola fourth 2017 admission 02/16/2015 moved to ICU for BiPAP 02/17/15 > Intubated  LINES/TUBES: February 16 2015 BiPAP  DISCUSSION: This is a 82 year old male who has gold stage II airflow obstruction but Gold grade D COPD based on multiple recurrent exacerbations and severe symptoms who continues to smoke cigarettes who is now in [facility] with acute on chronic hypoxemic respiratory  failure secondary to a COPD exacerbation. He has a questionable right lung infiltrate with a slightly elevated white blood cell count in the setting of steroid use. Is not clear to me that he has pneumonia but considering the fact that his respiratory status has worsened think he needs anabolic coverage for now. He has recurrent COPD exacerbations because he continues to smoke cigarettes. This is explained to him at length today. I don't believe he thinks that cigarettes are actually causing a problem with his health. He is currently high risk for intubation.  Notably, he has had some leg swelling. Sometimes pulmonary embolism can cause an acute exacerbation of COPD. He is certainly at risk for PE considering his multiple recent exacerbations.  ASSESSMENT / PLAN:  PULMONARY A: Acute respiratory failure with hypoxemia Acute exacerbation of COPD Active tobacco use Questionable healthcare associated pneumonia Intolerance of nebulized med? (unclear, became more dyspneic and tachycardic after duoneb and pulmicort) Respiratory Failure Requiring Intubation P:   Continue solu-medrol. Day 3/5 , decreasing dose to  BID.  Cefepime 2/7 >  Azithro 2/7 > VAP prevention Full vent support, Wean vent today.  Consider adjustment of Inhaler regimen once extubated.  Will discuss with Radiology the utility of VQ in this patient.   CARDIOVASCULAR A:  Sinus tachycardia Elevated JVD, questionable cor pulmonale Leg swelling, differential diagnosis includes edema versus like clot P:  Follow-up echocardiogram > no evidence of RH compromise.  Telemetry monitoring  Duplex of LE negative.  VTACH this am.  EKG Troponin  RENAL A:   Worsening renal function, unclear cause Urinary Retention P:   Check urinalysis and Urine Cr and Urine sodium Acute urinary retention possibly contributing. Foley placed.  Monitor BMET and UOP Replace electrolytes as needed Renal ultrasound Fluid challenge.  ? Renal  Consult if workup unrevealing and renal function continues to worsen  GASTROINTESTINAL A:   No acute issues  P:   Nothing by mouth while on BiPAP  HEMATOLOGIC A:   No acute issues Leukocytosis Resolving P:  Monitor for bleeding  INFECTIOUS A:   Questionable healthcare associated pneumonia with worsening leukocytosis (the rising white count could be due to steroids) P:   Cefepime and azithromycin 2/7 > Pct algorithm 0.28 this am.  Leukocytosis improving.   ENDOCRINE A:   No acute issues   P:  Monitor glucose with lab work  NEUROLOGIC A:   No acute issues P:   Sedated on vent Rass goal -2 Watch closely.    FAMILY  - Updates: Wife updated at bedside by me on 02/16/2015  - Inter-disciplinary family meet or Palliative Care meeting due by:  Day 7   Devota Pace, MD Family Medicine - PGY 2

## 2015-02-18 NOTE — Progress Notes (Signed)
eLink Physician-Brief Progress Note Patient Name: Joe Gonzalez DOB: 1933/02/16 MRN: [id number]   Date of Service  02/18/2015  HPI/Events of Note  Camera check on patient. Remains on ventilator with FiO2 0.4 & acceptable peak pressure. Sedated on Propofol & Fentanyl drips. Hemodynamically stable.  eICU Interventions  No interventions.     Intervention Category Intermediate Interventions: Other:  Lawanda Cousins 02/18/2015, 10:47 PM

## 2015-02-19 ENCOUNTER — Inpatient Hospital Stay (HOSPITAL_COMMUNITY): Payer: Medicare Other

## 2015-02-19 LAB — TRIGLYCERIDES: TRIGLYCERIDES: 48 mg/dL (ref <150)

## 2015-02-19 LAB — CBC
HCT: 33.8 % — ABNORMAL LOW (ref 39.0–52.0)
HEMOGLOBIN: 11.2 g/dL — AB (ref 13.0–17.0)
MCH: 32.3 pg (ref 26.0–34.0)
MCHC: 33.1 g/dL (ref 30.0–36.0)
MCV: 97.4 fL (ref 78.0–100.0)
Platelets: 88 10*3/uL — ABNORMAL LOW (ref 150–400)
RBC: 3.47 MIL/uL — AB (ref 4.22–5.81)
RDW: 14.9 % (ref 11.5–15.5)
WBC: 9 10*3/uL (ref 4.0–10.5)

## 2015-02-19 LAB — GLUCOSE, CAPILLARY
GLUCOSE-CAPILLARY: 146 mg/dL — AB (ref 65–99)
GLUCOSE-CAPILLARY: 127 mg/dL — AB (ref 65–99)
GLUCOSE-CAPILLARY: 114 mg/dL — AB (ref 65–99)
Glucose-Capillary: 134 mg/dL — ABNORMAL HIGH (ref 65–99)
Glucose-Capillary: 156 mg/dL — ABNORMAL HIGH (ref 65–99)
Glucose-Capillary: 112 mg/dL — ABNORMAL HIGH (ref 65–99)

## 2015-02-19 LAB — HEPARIN LEVEL (UNFRACTIONATED)
HEPARIN UNFRACTIONATED: 1.3 [IU]/mL — AB (ref 0.30–0.70)
HEPARIN UNFRACTIONATED: 0.93 [IU]/mL — AB (ref 0.30–0.70)

## 2015-02-19 LAB — BASIC METABOLIC PANEL
ANION GAP: 12 (ref 5–15)
BUN: 134 mg/dL — AB (ref 6–20)
CHLORIDE: 105 mmol/L (ref 101–111)
CO2: 20 mmol/L — ABNORMAL LOW (ref 22–32)
Calcium: 7.2 mg/dL — ABNORMAL LOW (ref 8.9–10.3)
Creatinine, Ser: 3.23 mg/dL — ABNORMAL HIGH (ref 0.61–1.24)
GFR calc Af Amer: 19 mL/min — ABNORMAL LOW (ref >60)
GFR calc non Af Amer: 17 mL/min — ABNORMAL LOW (ref >60)
GLUCOSE: 159 mg/dL — AB (ref 65–99)
POTASSIUM: 5.4 mmol/L — AB (ref 3.5–5.1)
SODIUM: 137 mmol/L (ref 135–145)

## 2015-02-19 LAB — POCT I-STAT 3, ART BLOOD GAS (G3+)
ACID-BASE DEFICIT: 5 mmol/L — AB (ref 0.0–2.0)
Bicarbonate: 22.9 mEq/L (ref 20.0–24.0)
O2 Saturation: 98 %
PH ART: 7.241 — AB (ref 7.350–7.450)
TCO2: 24 mmol/L (ref 0–100)
pCO2 arterial: 53.4 mmHg — ABNORMAL HIGH (ref 35.0–45.0)
pO2, Arterial: 123 mmHg — ABNORMAL HIGH (ref 80.0–100.0)

## 2015-02-19 LAB — PROCALCITONIN: Procalcitonin: 0.18 ng/mL

## 2015-02-19 MED ORDER — METHYLPREDNISOLONE SODIUM SUCC 40 MG IJ SOLR
40.0000 mg | Freq: Two times a day (BID) | INTRAMUSCULAR | Status: DC
  Administered 2015-02-19 – 2015-02-20 (×2): 40 mg via INTRAVENOUS
  Filled 2015-02-19 (×4): qty 1

## 2015-02-19 MED ORDER — POLYETHYLENE GLYCOL 3350 17 G PO PACK
17.0000 g | PACK | Freq: Every day | ORAL | Status: DC
  Administered 2015-02-19 – 2015-02-21 (×3): 17 g via ORAL
  Filled 2015-02-19 (×3): qty 1

## 2015-02-19 MED ORDER — HEPARIN (PORCINE) IN NACL 100-0.45 UNIT/ML-% IJ SOLN
800.0000 [IU]/h | INTRAMUSCULAR | Status: DC
  Administered 2015-02-20: 1150 [IU]/h via INTRAVENOUS
  Filled 2015-02-19 (×5): qty 250

## 2015-02-19 MED ORDER — BISACODYL 10 MG RE SUPP
10.0000 mg | Freq: Every day | RECTAL | Status: DC | PRN
  Filled 2015-02-19: qty 1

## 2015-02-19 MED ORDER — TECHNETIUM TO 99M ALBUMIN AGGREGATED
3.1000 | Freq: Once | INTRAVENOUS | Status: AC | PRN
  Administered 2015-02-19: 3 via INTRAVENOUS

## 2015-02-19 MED ORDER — HEPARIN (PORCINE) IN NACL 100-0.45 UNIT/ML-% IJ SOLN
1300.0000 [IU]/h | INTRAMUSCULAR | Status: DC
  Administered 2015-02-19: 1300 [IU]/h via INTRAVENOUS
  Filled 2015-02-19 (×2): qty 250

## 2015-02-19 MED ORDER — HEPARIN BOLUS VIA INFUSION
4000.0000 [IU] | Freq: Once | INTRAVENOUS | Status: AC
  Administered 2015-02-19: 4000 [IU] via INTRAVENOUS
  Filled 2015-02-19: qty 4000

## 2015-02-19 MED ORDER — POLYETHYLENE GLYCOL 3350 17 G PO PACK
17.0000 g | PACK | Freq: Every day | ORAL | Status: DC
  Filled 2015-02-19: qty 1

## 2015-02-19 NOTE — Progress Notes (Signed)
Patient in Nuc Med. Report received from Hazle Coca RN at [phone]. VS stable. Monitored. Resp tech present. Scan was completed. Portable CXR done and patient transported back to room by Kennon Portela RN and transporter and resp. Tech. Monitored. VS remained stable. Report given to [facility].

## 2015-02-19 NOTE — Progress Notes (Signed)
ANTICOAGULATION CONSULT NOTE  Pharmacy Consult for heparin Indication: pulmonary embolus  No Known Allergies  Patient Measurements: Height:  (170.2 cm) Weight: 170 lb 10.2 oz (77.4 kg) IBW/kg (Calculated) : 66.1   Vital Signs: Temp: 99.1 F (37.3 C) (02/09 2003) Temp Source: Oral (02/09 2003) BP: 106/58 mmHg (02/09 1939) Pulse Rate: 85 (02/09 1939)  Labs:  Recent Labs  02/18/15 0250 02/18/15 0939 02/18/15 1506 02/18/15 2150 02/19/15 0355 02/19/15 1835 02/19/15 2057  HGB 12.7*  --   --   --  11.2*  --   --   HCT 38.3*  --   --   --  33.8*  --   --   PLT 101*  --   --   --  88*  --   --   HEPARINUNFRC  --   --   --   --   --  1.30* 0.93*  CREATININE 3.27*  --  3.38*  --  3.23*  --   --   TROPONINI  --  <0.03 0.03 0.05*  --   --   --     Estimated Creatinine Clearance: 17.3 mL/min (by C-G formula based on Cr of 3.23).   Medical History: Past Medical History  Diagnosis Date  . COPD (chronic obstructive pulmonary disease) (HCC)   . Hypertension   . Asthma   . Restless leg syndrome    Assessment: 82 yo m admitted on 2/4 with dyspnea. Elevated D-dimer, unable to do CTA, LE dopplers negative.  Concern for PE, so pharmacy is consulted to dose heparin for possible PE. Hgb 11.2, plts 88 - watch.   Initial heparin level elevated at 1.3, however was drawn close to the site where heparin infusing.  Rechecked from opposite arm, still above goal at 0.93.  No bleeding or complications noted.  Goal of Therapy:  Heparin level 0.3-0.7 units/ml Monitor platelets by anticoagulation protocol: Yes   Plan:  Reduce IV heparin to 1150 units/hr. Recheck heparin level in ~ 8 hrs (with AM labs) Daily HL, CBC Monitor s/s of bleeding Watch platelet count.  Tad Moore, BCPS  Clinical Pharmacist Pager [phone]  02/19/2015 9:48 PM

## 2015-02-19 NOTE — Progress Notes (Signed)
ANTICOAGULATION CONSULT NOTE - Initial Consult  Pharmacy Consult for heparin Indication: pulmonary embolus  No Known Allergies  Patient Measurements: Height:  (170.2 cm) Weight: 170 lb 10.2 oz (77.4 kg) IBW/kg (Calculated) : 66.1   Vital Signs: Temp: 98.7 F (37.1 C) (02/09 0846) Temp Source: Oral (02/09 0846) BP: 122/63 mmHg (02/09 0840) Pulse Rate: 84 (02/09 0740)  Labs:  Recent Labs  02/16/15 1112  02/18/15 0250 02/18/15 0939 02/18/15 1506 02/18/15 2150 02/19/15 0355  HGB 14.1  --  12.7*  --   --   --  11.2*  HCT 42.9  --  38.3*  --   --   --  33.8*  PLT 142*  --  101*  --   --   --  88*  CREATININE 1.91*  < > 3.27*  --  3.38*  --  3.23*  TROPONINI 0.04*  < >  --  <0.03 0.03 0.05*  --   < > = values in this interval not displayed.  Estimated Creatinine Clearance: 17.3 mL/min (by C-G formula based on Cr of 3.23).   Medical History: Past Medical History  Diagnosis Date  . COPD (chronic obstructive pulmonary disease) (HCC)   . Hypertension   . Asthma   . Restless leg syndrome    Assessment: 82 yo m admitted on 2/4 with dyspnea. Elevated D-dimer, unable to do CTA, LE dopplers negative.  Concern for PE, so pharmacy is consulted to dose heparin for possible PE. Hgb 11.2, plts 88 - watch.   Goal of Therapy:  Heparin level 0.3-0.7 units/ml Monitor platelets by anticoagulation protocol: Yes   Plan:  Heparin bolus 4,000 units x 1 Heparin infusion at 1300 units/hr (~16.8 units/kg/hr)  F/u 8-hr HL @ 1730 Daily HL, CBC Monitor s/s of bleeding  Cassie L. Roseanne Reno, PharmD PGY2 Infectious Diseases Pharmacy Resident Pager: [phone] 02/19/2015 10:07 AM

## 2015-02-19 NOTE — Progress Notes (Signed)
UR Completed. Little Bashore, RN, BSN.  [phone] 

## 2015-02-19 NOTE — Progress Notes (Signed)
PULMONARY / CRITICAL CARE MEDICINE   Name: Joe Gonzalez MRN: [id number] DOB: 1933-01-23    ADMISSION DATE:  Mar 11, 2015 CONSULTATION DATE:  02/16/2015  REFERRING MD:  Dr. Arbutus Leas with [facility]  CHIEF COMPLAINT:  Shortness of breath  HISTORY OF PRESENT ILLNESS:   This is a 82 year old male with a past medical history significant for severe COPD who has had multiple recurrent admissions since October 2016 who presented to the [facility] department on March 11, 2015 complaining of shortness of breath. He tells me that he had been in his usual state of health but starting Friday morning he woke up with shortness of breath. He did not have mucus production but he has had a cough and wheezing. He reports that he has been compliant with his Symbicort and Spiriva. He also notes that he continues to smoke cigarettes. He attributes his lung problems to dust and "fume" exposure over the years. He says that he has not had a fever, chills, headache, or body aches. He denies any sort of sick contacts recently. According to both he and his wife he says that he never produces any mucus when he gets sick. He has noted some leg swelling in his right leg recently. He denies chest pain. He was admitted to our hospital on Mar 11, 2015 and was treated with bronchodilators as well as steroids. He became more short of breath overnight and was moved to the intensive care unit early this morning on 02/16/2015. He does note that after he received bronchodilator treatment early in the morning he felt that this made his breathing worse. Nursing notes that after his DuoNeb treatment this morning he did have a significant episode of tachycardia and tachypnea. He says that he is now feeling a little bit better.  PAST MEDICAL HISTORY :  He  has a past medical history of COPD (chronic obstructive pulmonary disease) (HCC); Hypertension; Asthma; and Restless leg syndrome.  PAST SURGICAL HISTORY: He  has past surgical  history that includes Hernia repair; Back surgery; Rotator cuff repair; and Knee Arthroplasty.  No Known Allergies  No current facility-administered medications on file prior to encounter.   Current Outpatient Prescriptions on File Prior to Encounter  Medication Sig  . amLODipine (NORVASC) 5 MG tablet Take 5 mg by mouth daily.  Marland Kitchen aspirin EC 81 MG EC tablet Take 1 tablet (81 mg total) by mouth daily.  . budesonide-formoterol (SYMBICORT) 160-4.5 MCG/ACT inhaler Take 2 puffs first thing in am and then another 2 puffs about 12 hours later. (Patient taking differently: Inhale 2 puffs into the lungs every 12 (twelve) hours. )  . clorazepate (TRANXENE) 3.75 MG tablet Take 3.75 mg by mouth daily as needed for anxiety.  Marland Kitchen doxazosin (CARDURA) 2 MG tablet Take 2 mg by mouth daily.  . furosemide (LASIX) 20 MG tablet Take 20 mg by mouth daily as needed for fluid (ankle swelling).   Marland Kitchen guaiFENesin (MUCINEX) 600 MG 12 hr tablet Take 1 tablet (600 mg total) by mouth 2 (two) times daily.  Marland Kitchen ipratropium-albuterol (DUONEB) 0.5-2.5 (3) MG/3ML SOLN Take 3 mLs by nebulization every 6 (six) hours as needed (wheezing).  Marland Kitchen losartan (COZAAR) 100 MG tablet Take 100 mg by mouth daily.  . potassium chloride SA (K-DUR,KLOR-CON) 20 MEQ tablet Take 10 mEq by mouth daily as needed (take with furosemide dose).   . predniSONE (DELTASONE) 10 MG tablet Take  4 each am x 2 days,  3 each am x 2 days,  2 each am x  2 days, 1 each am x 2 days  ,then  Stop. (Patient taking differently: Take 10-40 mg by mouth daily with breakfast. Tapered course prescribed 02/02/15: take 4 tablets (40 mg) by mouth daily for 2 days, then take 3 tablets (30 mg) daily for 2 days, then take 2 tablets (20 mg) daily for 2 days, then take 1 tablet  (10 mg) daily for 2 days, then stop)  . Tiotropium Bromide Monohydrate (SPIRIVA RESPIMAT) 2.5 MCG/ACT AERS 2 pffs each am (Patient taking differently: Inhale 2 puffs into the lungs daily. 2 pffs each a)  . traMADol  (ULTRAM) 50 MG tablet Take 50 mg by mouth 3 (three) times daily as needed for moderate pain.     FAMILY HISTORY:  He did not report a family history of lung disease  SOCIAL HISTORY: He  reports that he has been smoking Cigarettes.  He has a 10 pack-year smoking history. He has never used smokeless tobacco. He reports that he does not drink alcohol or use illicit drugs.  REVIEW OF SYSTEMS:   Could not obtain full review of systems. Intubated.   SUBJECTIVE:  Tolerating Ventilation No agitation overnight.  Urine output picking up.   VITAL SIGNS: BP 96/52 mmHg  Pulse 84  Temp(Src) 98.4 F (36.9 C) (Oral)  Resp 16  Ht  (1.702 m)  Wt 170 lb 10.2 oz (77.4 kg)  BMI 26.72 kg/m2  SpO2 97%  VENTILATOR SETTINGS: Vent Mode:  [-] PRVC FiO2 (%):  [40 %] 40 % Set Rate:  [12 bmp-20 bmp] 14 bmp Vt Set:  [530 mL] 530 mL PEEP:  [5 cmH20] 5 cmH20 Plateau Pressure:  [16 cmH20-29 cmH20] 19 cmH20  INTAKE / OUTPUT: I/O last 3 completed shifts: In: 4741.5 [I.V.:2666.5; Other:100; NG/GT:1175; IV Piggyback:800] Out: 1605 [Urine:1605]  PHYSICAL EXAMINATION: General:  Elderly male, tachypneic intubated, improved accessory muscle use Neuro:  Sedated. HEENT:  Normocephalic atraumatic Cardiovascular:  Tachycardic but regular, distant heart sounds, no clear murmurs, Lungs:  Improved aeration, Clear bilaterally with decent air movement. Only a slight wheeze this am.  Abdomen:  Bowel sounds are positive, nontender nondistended Musculoskeletal:  Normal bulk and tone Skin:  Thin skin bilaterally with some bruising, no cyanosis  LABS:  BMET  Recent Labs Lab 02/18/15 0250 02/18/15 1506 02/19/15 0355  NA 138 137 137  K 5.3* 4.8 5.4*  CL 102 104 105  CO2 21* 19* 20*  BUN 103* 122* 134*  CREATININE 3.27* 3.38* 3.23*  GLUCOSE 141* 155* 159*    Electrolytes  Recent Labs Lab 02/15/15 0544  02/18/15 0250 02/18/15 0939 02/18/15 1506 02/19/15 0355  CALCIUM 8.5*  < > 8.2*  --  7.5*  7.2*  MG 2.8*  --   --  2.7*  --   --   PHOS 4.5  --   --  9.0*  --   --   < > = values in this interval not displayed.  CBC  Recent Labs Lab 02/16/15 1112 02/18/15 0250 02/19/15 0355  WBC 19.0* 13.1* 9.0  HGB 14.1 12.7* 11.2*  HCT 42.9 38.3* 33.8*  PLT 142* 101* 88*    Coag's No results for input(s): APTT, INR in the last 168 hours.  Sepsis Markers  Recent Labs Lab 02/17/15 0230 02/18/15 0250 02/19/15 0355  PROCALCITON 0.27 0.28 0.18    ABG  Recent Labs Lab 02/17/15 0849 02/17/15 2120 02/18/15 1311  PHART 7.303* 7.250* 7.234*  PCO2ART 40.5 47.6* 51.2*  PO2ART 68.0* 103* 65.0*  Liver Enzymes  Recent Labs Lab 02/15/15 0544  AST 21  ALT 23  ALKPHOS 49  BILITOT 0.5  ALBUMIN 2.9*    Cardiac Enzymes  Recent Labs Lab 02/18/15 0939 02/18/15 1506 02/18/15 2150  TROPONINI <0.03 0.03 0.05*    Glucose  Recent Labs Lab 02/18/15 0403 02/18/15 0740 02/18/15 1538 02/18/15 2011 02/19/15 0051 02/19/15 0539  GLUCAP 134* 128* 140* 152* 134* 146*    Imaging 02/16/2015 chest x-ray images personally reviewed showing a questionable right lower lobe infiltrate which is new compared to the images from 02/01/2015 there is also significant hyperinflation consistent with emphysema  STUDIES:  02/16/2015 echocardiogram preserved ejection fraction, pulmonary hypertension.  02/16/2015 lower extremities Doppler negative.  02/18/2015 CXR no significant change.  02/19/2015 VQ scan pending.   CULTURES: None  ANTIBIOTICS: 02/16/2015 cefepime 02/16/2015 azithromycin > 02/19/2015  SIGNIFICANT EVENTS: Minerva Areola fourth 2017 admission 02/16/2015 moved to ICU for BiPAP 02/17/15 > Intubated 02/18/2015 > failed extubation.   LINES/TUBES: February 16 2015 BiPAP  DISCUSSION: This is a 82 year old male who has gold stage II airflow obstruction but Gold grade D COPD based on multiple recurrent exacerbations and severe symptoms who continues to smoke cigarettes who is now in  [facility] with acute on chronic hypoxemic respiratory failure secondary to a COPD exacerbation. He has a questionable right lung infiltrate with a slightly elevated white blood cell count in the setting of steroid use. Is not clear to me that he has pneumonia but considering the fact that his respiratory status has worsened think he needs anabolic coverage for now. He has recurrent COPD exacerbations because he continues to smoke cigarettes. This is explained to him at length today. I don't believe he thinks that cigarettes are actually causing a problem with his health. He is currently high risk for intubation.  Notably, he has had some leg swelling. Sometimes pulmonary embolism can cause an acute exacerbation of COPD. He is certainly at risk for PE considering his multiple recent exacerbations.  ASSESSMENT / PLAN:  PULMONARY A: Acute respiratory failure with hypoxemia Acute exacerbation of COPD Active tobacco use Questionable healthcare associated pneumonia Intolerance of nebulized med? (unclear, became more dyspneic and tachycardic after duoneb and pulmicort) Respiratory Failure Requiring Intubation P:   Continue solu-medrol. Day 4/5  BID.  Cefepime 2/6 >  Azithro 2/6 >2/9 VAP prevention Full vent support, May try to wean tomorrow.  Consider adjustment of Inhaler regimen once extubated.  VQ not done yesterday. Going this am for scan.   CARDIOVASCULAR A:  Sinus tachycardia Elevated JVD, questionable cor pulmonale Leg swelling, differential diagnosis includes edema versus like clot P:  Follow-up echocardiogram > no evidence of RH compromise.  Telemetry monitoring  Duplex of LE negative.  No further VTACH EKG - no acute changes.  Troponin - negative  RENAL A:   Worsening renal function, unclear cause Urinary Retention Hyperkalemia - mild P:   FENA > 0.2% Creatinine stabilizing.  UOP picking up with fluid administration.  Monitor BMET and UOP Kayexalate for  mild hyperkalemia.  Renal ultrasound only shows renal cysts.  Hold off on renal consult for now.   GASTROINTESTINAL A:   No acute issues P:   Nothing by mouth while on BiPAP Protonix ppx.   HEMATOLOGIC A:   No acute issues Leukocytosis Resolved Thrombocytopenia - may be dilutional.  P:  Monitor for bleeding Consider HITT panel and stopping heparin Monitor Hgb. Mild drop likely dilutional.   INFECTIOUS A:   Questionable healthcare associated pneumonia with worsening  leukocytosis (the rising white count could be due to steroids) P:   Cefepime and azithromycin day 4/7 , 2/7 > D/C Azithromycin today since treated with Levaquin prior to Vanc / Azithro.   Pct algorithm 0.28 > 0.18.  Leukocytosis improving.   ENDOCRINE A:   No acute issues   P:   Monitor glucose with lab work  NEUROLOGIC A:   No acute issues P:   Sedated on vent Rass goal -2 Watch closely.    FAMILY  - Updates: Wife updated at bedside.   - Inter-disciplinary family meet or Palliative Care meeting due by:  Day 7    Devota Pace, MD Family Medicine - PGY 2

## 2015-02-19 NOTE — Care Management Important Message (Signed)
Important Message  Patient Details  Name: JAKSEN FIORELLA MRN: [id number] Date of Birth: [birth year]   Medicare Important Message Given:  Yes    Bernadette Hoit 02/19/2015, 8:03 AM

## 2015-02-20 ENCOUNTER — Inpatient Hospital Stay (HOSPITAL_COMMUNITY): Payer: Medicare Other

## 2015-02-20 LAB — GLUCOSE, CAPILLARY
GLUCOSE-CAPILLARY: 124 mg/dL — AB (ref 65–99)
GLUCOSE-CAPILLARY: 125 mg/dL — AB (ref 65–99)
GLUCOSE-CAPILLARY: 135 mg/dL — AB (ref 65–99)
Glucose-Capillary: 116 mg/dL — ABNORMAL HIGH (ref 65–99)
Glucose-Capillary: 137 mg/dL — ABNORMAL HIGH (ref 65–99)
Glucose-Capillary: 135 mg/dL — ABNORMAL HIGH (ref 65–99)

## 2015-02-20 LAB — BASIC METABOLIC PANEL
Anion gap: 13 (ref 5–15)
BUN: 155 mg/dL — AB (ref 6–20)
CHLORIDE: 107 mmol/L (ref 101–111)
CO2: 23 mmol/L (ref 22–32)
Calcium: 7.4 mg/dL — ABNORMAL LOW (ref 8.9–10.3)
Creatinine, Ser: 2.88 mg/dL — ABNORMAL HIGH (ref 0.61–1.24)
GFR calc Af Amer: 22 mL/min — ABNORMAL LOW (ref >60)
GFR calc non Af Amer: 19 mL/min — ABNORMAL LOW (ref >60)
GLUCOSE: 136 mg/dL — AB (ref 65–99)
POTASSIUM: 5.8 mmol/L — AB (ref 3.5–5.1)
Sodium: 143 mmol/L (ref 135–145)

## 2015-02-20 LAB — CBC
HEMATOCRIT: 34.4 % — AB (ref 39.0–52.0)
HEMOGLOBIN: 10.8 g/dL — AB (ref 13.0–17.0)
MCH: 31.1 pg (ref 26.0–34.0)
MCHC: 31.4 g/dL (ref 30.0–36.0)
MCV: 99.1 fL (ref 78.0–100.0)
Platelets: 72 10*3/uL — ABNORMAL LOW (ref 150–400)
RBC: 3.47 MIL/uL — ABNORMAL LOW (ref 4.22–5.81)
RDW: 15 % (ref 11.5–15.5)
WBC: 9.5 10*3/uL (ref 4.0–10.5)

## 2015-02-20 LAB — HEPARIN LEVEL (UNFRACTIONATED)
Heparin Unfractionated: 0.96 IU/mL — ABNORMAL HIGH (ref 0.30–0.70)
Heparin Unfractionated: 0.8 IU/mL — ABNORMAL HIGH (ref 0.30–0.70)

## 2015-02-20 LAB — PHOSPHORUS: Phosphorus: 9.2 mg/dL — ABNORMAL HIGH (ref 2.5–4.6)

## 2015-02-20 MED ORDER — FENTANYL CITRATE (PF) 100 MCG/2ML IJ SOLN
25.0000 ug | INTRAMUSCULAR | Status: DC | PRN
  Administered 2015-02-21: 25 ug via INTRAVENOUS
  Filled 2015-02-20: qty 2

## 2015-02-20 MED ORDER — FENTANYL CITRATE (PF) 100 MCG/2ML IJ SOLN
50.0000 ug | INTRAMUSCULAR | Status: DC | PRN
  Administered 2015-02-20: 50 ug via INTRAVENOUS

## 2015-02-20 MED ORDER — FENTANYL CITRATE (PF) 100 MCG/2ML IJ SOLN: 50.0000 ug | INTRAMUSCULAR | Status: DC | PRN

## 2015-02-20 MED ORDER — METHYLPREDNISOLONE SODIUM SUCC 40 MG IJ SOLR
40.0000 mg | INTRAMUSCULAR | Status: DC
  Administered 2015-02-21: 40 mg via INTRAVENOUS
  Filled 2015-02-20: qty 1

## 2015-02-20 MED ORDER — SODIUM POLYSTYRENE SULFONATE 15 GM/60ML PO SUSP
15.0000 g | Freq: Once | ORAL | Status: AC
  Administered 2015-02-20: 15 g
  Filled 2015-02-20: qty 60

## 2015-02-20 MED ORDER — PHENYLEPHRINE HCL 10 MG/ML IJ SOLN
0.0000 ug/min | INTRAVENOUS | Status: DC
  Administered 2015-02-21: 20 ug/min via INTRAVENOUS
  Administered 2015-02-21: 100 ug/min via INTRAVENOUS
  Filled 2015-02-20 (×3): qty 1

## 2015-02-20 NOTE — Progress Notes (Signed)
eLink Physician-Brief Progress Note Patient Name: Joe Gonzalez DOB: [birth year] MRN: [id number]   Date of Service  02/20/2015  HPI/Events of Note  RN notified of improved BP off Fentanyl but now tachycardic. Sinus on telemetry.  eICU Interventions  Low dose Fentanyl IV push versus drip.     Intervention Category Intermediate Interventions: Pain - evaluation and management  Lawanda Cousins 02/20/2015, 11:40 PM

## 2015-02-20 NOTE — Progress Notes (Signed)
eLink Physician-Brief Progress Note Patient Name: Joe Gonzalez DOB: 08-21-1933 MRN: [id number]   Date of Service  02/20/2015  HPI/Events of Note  RN contacted regarding hypotension. Patient with PIV access & on heparin drip. Consistently I/O positive. Patient with some anasarca per bedside nurse. Tachycardia likely reflexive with borderline hypotension.  eICU Interventions  Neosynephrine low dose for MAP >65 & SBP >90.     Intervention Category Major Interventions: Shock - evaluation and management  Lawanda Cousins 02/20/2015, 10:24 PM

## 2015-02-20 NOTE — Progress Notes (Signed)
PULMONARY / CRITICAL CARE MEDICINE   Name: Joe Gonzalez MRN: [id number] DOB: 1933-01-23    ADMISSION DATE:  Mar 11, 2015 CONSULTATION DATE:  02/16/2015  REFERRING MD:  Dr. Arbutus Leas with [facility]  CHIEF COMPLAINT:  Shortness of breath  HISTORY OF PRESENT ILLNESS:   This is a 82 year old male with a past medical history significant for severe COPD who has had multiple recurrent admissions since October 2016 who presented to the [facility] emergency department on March 11, 2015 complaining of shortness of breath. He tells me that he had been in his usual state of health but starting Friday morning he woke up with shortness of breath. He did not have mucus production but he has had a cough and wheezing. He reports that he has been compliant with his Symbicort and Spiriva. He also notes that he continues to smoke cigarettes. He attributes his lung problems to dust and "fume" exposure over the years. He says that he has not had a fever, chills, headache, or body aches. He denies any sort of sick contacts recently. According to both he and his wife he says that he never produces any mucus when he gets sick. He has noted some leg swelling in his right leg recently. He denies chest pain. He was admitted to our hospital on Mar 11, 2015 and was treated with bronchodilators as well as steroids. He became more short of breath overnight and was moved to the intensive care unit early this morning on 02/16/2015. He does note that after he received bronchodilator treatment early in the morning he felt that this made his breathing worse. Nursing notes that after his DuoNeb treatment this morning he did have a significant episode of tachycardia and tachypnea. He says that he is now feeling a little bit better.  PAST MEDICAL HISTORY :  He  has a past medical history of COPD (chronic obstructive pulmonary disease) (HCC); Hypertension; Asthma; and Restless leg syndrome.  PAST SURGICAL HISTORY: He  has past surgical  history that includes Hernia repair; Back surgery; Rotator cuff repair; and Knee Arthroplasty.  No Known Allergies  No current facility-administered medications on file prior to encounter.   Current Outpatient Prescriptions on File Prior to Encounter  Medication Sig  . amLODipine (NORVASC) 5 MG tablet Take 5 mg by mouth daily.  Marland Kitchen aspirin EC 81 MG EC tablet Take 1 tablet (81 mg total) by mouth daily.  . budesonide-formoterol (SYMBICORT) 160-4.5 MCG/ACT inhaler Take 2 puffs first thing in am and then another 2 puffs about 12 hours later. (Patient taking differently: Inhale 2 puffs into the lungs every 12 (twelve) hours. )  . clorazepate (TRANXENE) 3.75 MG tablet Take 3.75 mg by mouth daily as needed for anxiety.  Marland Kitchen doxazosin (CARDURA) 2 MG tablet Take 2 mg by mouth daily.  . furosemide (LASIX) 20 MG tablet Take 20 mg by mouth daily as needed for fluid (ankle swelling).   Marland Kitchen guaiFENesin (MUCINEX) 600 MG 12 hr tablet Take 1 tablet (600 mg total) by mouth 2 (two) times daily.  Marland Kitchen ipratropium-albuterol (DUONEB) 0.5-2.5 (3) MG/3ML SOLN Take 3 mLs by nebulization every 6 (six) hours as needed (wheezing).  Marland Kitchen losartan (COZAAR) 100 MG tablet Take 100 mg by mouth daily.  . potassium chloride SA (K-DUR,KLOR-CON) 20 MEQ tablet Take 10 mEq by mouth daily as needed (take with furosemide dose).   . predniSONE (DELTASONE) 10 MG tablet Take  4 each am x 2 days,  3 each am x 2 days,  2 each am x  2 days, 1 each am x 2 days  ,then  Stop. (Patient taking differently: Take 10-40 mg by mouth daily with breakfast. Tapered course prescribed 02/02/15: take 4 tablets (40 mg) by mouth daily for 2 days, then take 3 tablets (30 mg) daily for 2 days, then take 2 tablets (20 mg) daily for 2 days, then take 1 tablet  (10 mg) daily for 2 days, then stop)  . Tiotropium Bromide Monohydrate (SPIRIVA RESPIMAT) 2.5 MCG/ACT AERS 2 pffs each am (Patient taking differently: Inhale 2 puffs into the lungs daily. 2 pffs each a)  . traMADol  (ULTRAM) 50 MG tablet Take 50 mg by mouth 3 (three) times daily as needed for moderate pain.     FAMILY HISTORY:  He did not report a family history of lung disease  SOCIAL HISTORY: He  reports that he has been smoking Cigarettes.  He has a 10 pack-year smoking history. He has never used smokeless tobacco. He reports that he does not drink alcohol or use illicit drugs.  REVIEW OF SYSTEMS:   Could not obtain full review of systems. Intubated.   SUBJECTIVE:  Urine output picking up Vent settings improving.  No acute issues overnight.   VITAL SIGNS: BP 84/59 mmHg  Pulse 96  Temp(Src) 98.6 F (37 C) (Oral)  Resp 9  Ht  (1.702 m)  Wt 167 lb 8.8 oz (76 kg)  BMI 26.24 kg/m2  SpO2 94%  VENTILATOR SETTINGS: Vent Mode:  [-] PRVC FiO2 (%):  [40 %-100 %] 40 % Set Rate:  [14 bmp] 14 bmp Vt Set:  [530 mL] 530 mL PEEP:  [5 cmH20] 5 cmH20 Plateau Pressure:  [10 cmH20-24 cmH20] 10 cmH20  INTAKE / OUTPUT: I/O last 3 completed shifts: In: 6049.2 [I.V.:4604.2; NG/GT:1395; IV Piggyback:50] Out: 2300 [Urine:2299; Stool:1]  PHYSICAL EXAMINATION: General:  Elderly male, tachypneic intubated Neuro:  Sedated. HEENT:  Normocephalic atraumatic Cardiovascular:  Tachycardic but regular, distant heart sounds,No MGR.  Lungs:  Improved aeration, Clear bilaterally with decent air movement. Only a slight wheeze.  Abdomen:  Bowel sounds are positive, nontender nondistended Musculoskeletal:  Normal bulk and tone Skin:  Thin skin bilaterally with some bruising, no cyanosis  LABS:  BMET  Recent Labs Lab 02/18/15 1506 02/19/15 0355 02/20/15 0350  NA 137 137 143  K 4.8 5.4* 5.8*  CL 104 105 107  CO2 19* 20* 23  BUN 122* 134* 155*  CREATININE 3.38* 3.23* 2.88*  GLUCOSE 155* 159* 136*    Electrolytes  Recent Labs Lab 02/15/15 0544  02/18/15 0939 02/18/15 1506 02/19/15 0355 02/20/15 0350  CALCIUM 8.5*  < >  --  7.5* 7.2* 7.4*  MG 2.8*  --  2.7*  --   --   --   PHOS 4.5  --   9.0*  --   --   --   < > = values in this interval not displayed.  CBC  Recent Labs Lab 02/18/15 0250 02/19/15 0355 02/20/15 0350  WBC 13.1* 9.0 9.5  HGB 12.7* 11.2* 10.8*  HCT 38.3* 33.8* 34.4*  PLT 101* 88* 72*    Coag's No results for input(s): APTT, INR in the last 168 hours.  Sepsis Markers  Recent Labs Lab 02/17/15 0230 02/18/15 0250 02/19/15 0355  PROCALCITON 0.27 0.28 0.18    ABG  Recent Labs Lab 02/18/15 1311 02/19/15 1146 02/20/15 0242  PHART 7.234* 7.241* 7.247*  PCO2ART 51.2* 53.4* 51.6*  PO2ART 65.0* 123.0* 99.6    Liver Enzymes  Recent Labs  Lab 02/15/15 0544  AST 21  ALT 23  ALKPHOS 49  BILITOT 0.5  ALBUMIN 2.9*    Cardiac Enzymes  Recent Labs Lab 02/18/15 0939 02/18/15 1506 02/18/15 2150  TROPONINI <0.03 0.03 0.05*    Glucose  Recent Labs Lab 02/19/15 0844 02/19/15 1132 02/19/15 1518 02/19/15 2000 02/19/15 2326 02/20/15 0349  GLUCAP 156* 127* 114* 112* 116* 137*    Imaging 02/16/2015 chest x-ray images personally reviewed showing a questionable right lower lobe infiltrate which is new compared to the images from 02/01/2015 there is also significant hyperinflation consistent with emphysema  02/20/15 CXR: RLL opacity likely a mass. Aeration improving. Bibasilar infiltrates.   STUDIES:  02/16/2015 echocardiogram preserved ejection fraction, pulmonary hypertension.  02/16/2015 lower extremities Doppler negative.  02/18/2015 CXR no significant change.  02/19/2015 VQ scan perfusion defect.   CULTURES: None  ANTIBIOTICS: 02/16/2015 cefepime 02/16/2015 azithromycin > 02/19/2015  SIGNIFICANT EVENTS: Minerva Areola fourth 2017 admission 02/16/2015 moved to ICU for BiPAP 02/17/15 > Intubated 02/18/2015 > failed extubation.  02/19/2015 > perfusion defect on VQ scan. Started empirically on heparin.   LINES/TUBES: February 16 2015 Intubated   DISCUSSION: This is a 82 year old male who has gold stage II airflow obstruction but Gold  grade D COPD based on multiple recurrent exacerbations and severe symptoms who continues to smoke cigarettes who is now in [facility] with acute on chronic hypoxemic respiratory failure secondary to a COPD exacerbation. He has a questionable right lung infiltrate with a slightly elevated white blood cell count in the setting of steroid use. Is not clear to me that he has pneumonia but considering the fact that his respiratory status has worsened think he needs anabolic coverage for now. He has recurrent COPD exacerbations because he continues to smoke cigarettes. This is explained to him at length today. I don't believe he thinks that cigarettes are actually causing a problem with his health. He is currently high risk for intubation.  Notably, he has had some leg swelling. Sometimes pulmonary embolism can cause an acute exacerbation of COPD. He is certainly at risk for PE considering his multiple recent exacerbations.  ASSESSMENT / PLAN:  PULMONARY A: Acute respiratory failure with hypoxemia Acute exacerbation of COPD Active tobacco use Questionable healthcare associated pneumonia Intolerance of nebulized med? (unclear, became more dyspneic and tachycardic after duoneb and pulmicort) Respiratory Failure Requiring Intubation P:   Continue solu-medrol. Day 5/5 40mg  BID.  Cefepime 2/6 >  Azithro 2/6 >2/9 VAP prevention Full vent support, May try to wean with improving vent settings.  Consider adjustment of Inhaler regimen once extubated.  VQ with perfusion defect > empiric Heparin.   CARDIOVASCULAR A:  Sinus tachycardia Elevated JVD, questionable cor pulmonale Leg swelling, differential diagnosis includes edema versus like clot P:  Follow-up echocardiogram > no evidence of RH compromise.  Telemetry monitoring  Duplex of LE negative.  No further VTACH EKG - no acute changes.  Troponin - negative  RENAL A:   Worsening renal function, unclear cause Urinary  Retention Hyperkalemia - mild P:   FENA > 0.2% Creatinine improving UOP picking up with fluid administration.  Monitor BMET and UOP Kayexalate for mild hyperkalemia.  Renal ultrasound only shows renal cysts.  Hold off on renal consult for now.   GASTROINTESTINAL A:   No acute issues P:   Nothing by mouth while on BiPAP Protonix ppx.   HEMATOLOGIC A:   No acute issues Leukocytosis Resolved Thrombocytopenia - may be dilutional.  P:  Monitor for bleeding Consider HITT  panel if PLT continue to drop.  4T's low probability.  Monitor Hgb. Mild drop likely dilutional.   INFECTIOUS A:   Questionable healthcare associated pneumonia with worsening leukocytosis (the rising white count could be due to steroids) P:   Cefepime and azithromycin day 5/7 , 2/7 > D/C Azithromycin today since treated with Levaquin prior to Vanc / Azithro.   Pct algorithm   Leukocytosis improved  ENDOCRINE A:   No acute issues   P:   Monitor glucose with lab work  NEUROLOGIC A:   No acute issues P:   Sedated on vent Rass goal -2 Watch closely.    FAMILY  - Updates: Wife updated at bedside.   - Inter-disciplinary family meet or Palliative Care meeting due by:  Day 7    Devota Pace, MD Family Medicine - PGY 2

## 2015-02-20 NOTE — Progress Notes (Signed)
ANTICOAGULATION CONSULT NOTE  Pharmacy Consult for heparin Indication: pulmonary embolus  No Known Allergies  Patient Measurements: Height:  (170.2 cm) Weight: 167 lb 8.8 oz (76 kg) IBW/kg (Calculated) : 66.1   Vital Signs: Temp: 98.4 F (36.9 C) (02/10 0347) Temp Source: Oral (02/10 0347) BP: 103/54 mmHg (02/10 0500) Pulse Rate: 94 (02/10 0500)  Labs:  Recent Labs  02/18/15 0250 02/18/15 0939 02/18/15 1506 02/18/15 2150 02/19/15 0355 02/19/15 1835 02/19/15 2057 02/20/15 0350 02/20/15 0400  HGB 12.7*  --   --   --  11.2*  --   --  10.8*  --   HCT 38.3*  --   --   --  33.8*  --   --  34.4*  --   PLT 101*  --   --   --  88*  --   --  72*  --   HEPARINUNFRC  --   --   --   --   --  1.30* 0.93*  --  0.96*  CREATININE 3.27*  --  3.38*  --  3.23*  --   --   --   --   TROPONINI  --  <0.03 0.03 0.05*  --   --   --   --   --     Estimated Creatinine Clearance: 17.3 mL/min (by C-G formula based on Cr of 3.23).   Assessment: 82 yo m on heparin for r/o PE. VQ scan shows perfusion abnormalities, could not exclude PE. Heparin level remains elevated (0.96) even with rate decrease. Hgb trending down to 10.8, plt down to 72 (156 on admission 2/4) -?dilutional. Per RN, small amount of blood in ET tube when suctioned and skin tear bleeding requiring dressing change.  Goal of Therapy:  Heparin level 0.3-0.7 units/ml Monitor platelets by anticoagulation protocol: Yes   Plan:  Reduce IV heparin to 900 units/hr. Recheck heparin level in 8 hours Monitor any worsening in ET tube bleeding Watch platelet count - will discuss with rounding team  Christoper Fabian, PharmD, BCPS Clinical pharmacist, pager [phone]  02/20/2015 6:13 AM

## 2015-02-20 NOTE — Progress Notes (Signed)
ANTICOAGULATION CONSULT NOTE  Pharmacy Consult for heparin Indication: pulmonary embolus  No Known Allergies  Patient Measurements: Height:  (170.2 cm) Weight: 167 lb 8.8 oz (76 kg) IBW/kg (Calculated) : 66.1   Vital Signs: Temp: 99.4 F (37.4 C) (02/10 1158) Temp Source: Oral (02/10 1158) BP: 121/110 mmHg (02/10 1400) Pulse Rate: 113 (02/10 1400)  Labs:  Recent Labs  02/18/15 0250 02/18/15 0939 02/18/15 1506 02/18/15 2150 02/19/15 0355  02/19/15 2057 02/20/15 0350 02/20/15 0400 02/20/15 1343  HGB 12.7*  --   --   --  11.2*  --   --  10.8*  --   --   HCT 38.3*  --   --   --  33.8*  --   --  34.4*  --   --   PLT 101*  --   --   --  88*  --   --  72*  --   --   HEPARINUNFRC  --   --   --   --   --   < > 0.93*  --  0.96* 0.80*  CREATININE 3.27*  --  3.38*  --  3.23*  --   --  2.88*  --   --   TROPONINI  --  <0.03 0.03 0.05*  --   --   --   --   --   --   < > = values in this interval not displayed.  Estimated Creatinine Clearance: 19.4 mL/min (by C-G formula based on Cr of 2.88).   Assessment: 82 yo m on heparin for r/o PE. VQ scan shows perfusion abnormalities, could not exclude PE. Heparin level remains supratherapeutic with rate change but trending down (0.96>0.80). Hgb trending down to 10.8, plts down to 72 - primary team aware, will watch and repeat tomorrow AM, likely note HIT as heparin was just initiated (< 5 days).  Per RN, still some blood around skin tear and coming from ET tube, but not any worse.   Goal of Therapy:   Heparin level 0.3-0.7 units/ml Monitor platelets by anticoagulation protocol: Yes   Plan:  Reduce heparin infusion to 750 units/hr F/u 8-hr HL @ 2330 Daily HL, CBC Monitor any worsening in ET tube bleeding Watch platelet count  Else Habermann L. Roseanne Reno, PharmD PGY2 Infectious Diseases Pharmacy Resident Pager: [phone] 02/20/2015 3:09 PM

## 2015-02-21 ENCOUNTER — Inpatient Hospital Stay (HOSPITAL_COMMUNITY): Payer: Medicare Other

## 2015-02-21 DIAGNOSIS — J430 Unilateral pulmonary emphysema [MacLeod's syndrome]: Secondary | ICD-10-CM

## 2015-02-21 DIAGNOSIS — N183 Chronic kidney disease, stage 3 (moderate): Secondary | ICD-10-CM

## 2015-02-21 DIAGNOSIS — J449 Chronic obstructive pulmonary disease, unspecified: Secondary | ICD-10-CM | POA: Insufficient documentation

## 2015-02-21 LAB — POCT I-STAT 3, ART BLOOD GAS (G3+)
ACID-BASE DEFICIT: 5 mmol/L — AB (ref 0.0–2.0)
BICARBONATE: 22.1 meq/L (ref 20.0–24.0)
O2 Saturation: 97 %
PCO2 ART: 49.8 mmHg — AB (ref 35.0–45.0)
PO2 ART: 100 mmHg (ref 80.0–100.0)
Patient temperature: 98.6
TCO2: 24 mmol/L (ref 0–100)
pH, Arterial: 7.254 — ABNORMAL LOW (ref 7.350–7.450)

## 2015-02-21 LAB — CBC
HEMATOCRIT: 28.8 % — AB (ref 39.0–52.0)
HEMOGLOBIN: 9.3 g/dL — AB (ref 13.0–17.0)
MCH: 31.6 pg (ref 26.0–34.0)
MCHC: 32.3 g/dL (ref 30.0–36.0)
MCV: 98 fL (ref 78.0–100.0)
Platelets: 115 10*3/uL — ABNORMAL LOW (ref 150–400)
RBC: 2.94 MIL/uL — ABNORMAL LOW (ref 4.22–5.81)
RDW: 15.1 % (ref 11.5–15.5)
WBC: 13.1 10*3/uL — ABNORMAL HIGH (ref 4.0–10.5)

## 2015-02-21 LAB — BASIC METABOLIC PANEL
Anion gap: 14 (ref 5–15)
BUN: 205 mg/dL — ABNORMAL HIGH (ref 6–20)
CHLORIDE: 106 mmol/L (ref 101–111)
CO2: 21 mmol/L — AB (ref 22–32)
CREATININE: 4.68 mg/dL — AB (ref 0.61–1.24)
Calcium: 7.3 mg/dL — ABNORMAL LOW (ref 8.9–10.3)
GFR calc non Af Amer: 11 mL/min — ABNORMAL LOW (ref >60)
GFR, EST AFRICAN AMERICAN: 12 mL/min — AB (ref >60)
Glucose, Bld: 191 mg/dL — ABNORMAL HIGH (ref 65–99)
POTASSIUM: 6.7 mmol/L — AB (ref 3.5–5.1)
Sodium: 141 mmol/L (ref 135–145)

## 2015-02-21 LAB — GLUCOSE, CAPILLARY
GLUCOSE-CAPILLARY: 155 mg/dL — AB (ref 65–99)
GLUCOSE-CAPILLARY: 159 mg/dL — AB (ref 65–99)
GLUCOSE-CAPILLARY: 174 mg/dL — AB (ref 65–99)
Glucose-Capillary: 139 mg/dL — ABNORMAL HIGH (ref 65–99)
Glucose-Capillary: 155 mg/dL — ABNORMAL HIGH (ref 65–99)

## 2015-02-21 LAB — TSH: TSH: 0.17 u[IU]/mL — ABNORMAL LOW (ref 0.350–4.500)

## 2015-02-21 LAB — HEPARIN LEVEL (UNFRACTIONATED)
HEPARIN UNFRACTIONATED: 0.29 [IU]/mL — AB (ref 0.30–0.70)
Heparin Unfractionated: 0.43 IU/mL (ref 0.30–0.70)

## 2015-02-21 LAB — TRIGLYCERIDES: TRIGLYCERIDES: 74 mg/dL (ref <150)

## 2015-02-21 MED ORDER — DEXTROSE 5 % IV SOLN
0.0000 ug/min | INTRAVENOUS | Status: DC
  Administered 2015-02-21 (×2): 100 ug/min via INTRAVENOUS
  Filled 2015-02-21: qty 4

## 2015-02-21 MED ORDER — ATROPINE SULFATE 0.1 MG/ML IJ SOLN
INTRAMUSCULAR | Status: AC
  Filled 2015-02-21: qty 10

## 2015-02-21 MED ORDER — ATROPINE SULFATE 1 MG/ML IJ SOLN: 0.4000 mg | Freq: Once | INTRAMUSCULAR | Status: DC

## 2015-02-21 MED ORDER — SODIUM CHLORIDE 0.9 % IV SOLN
1.0000 g | Freq: Once | INTRAVENOUS | Status: AC
  Administered 2015-02-21: 1 g via INTRAVENOUS
  Filled 2015-02-21: qty 10

## 2015-02-21 MED ORDER — DEXTROSE 50 % IV SOLN
1.0000 | Freq: Once | INTRAVENOUS | Status: AC
  Administered 2015-02-21: 50 mL via INTRAVENOUS
  Filled 2015-02-21: qty 50

## 2015-02-21 MED ORDER — STERILE WATER FOR INJECTION IV SOLN
INTRAVENOUS | Status: DC
  Administered 2015-02-21: 13:00:00 via INTRAVENOUS
  Filled 2015-02-21 (×2): qty 850

## 2015-02-21 MED ORDER — DOPAMINE-DEXTROSE 3.2-5 MG/ML-% IV SOLN
INTRAVENOUS | Status: AC
  Filled 2015-02-21: qty 250

## 2015-02-21 MED ORDER — SODIUM POLYSTYRENE SULFONATE 15 GM/60ML PO SUSP
30.0000 g | Freq: Once | ORAL | Status: AC
  Administered 2015-02-21: 30 g via ORAL
  Filled 2015-02-21: qty 120

## 2015-02-21 MED ORDER — DEXTROSE 50 % IV SOLN
INTRAVENOUS | Status: AC
  Administered 2015-02-21: 13:00:00
  Filled 2015-02-21: qty 50

## 2015-02-21 MED ORDER — SODIUM CHLORIDE 0.9 % IV BOLUS (SEPSIS)
1000.0000 mL | Freq: Once | INTRAVENOUS | Status: AC
  Administered 2015-02-21: 1000 mL via INTRAVENOUS

## 2015-02-21 MED ORDER — HEPARIN SODIUM (PORCINE) 5000 UNIT/ML IJ SOLN: 5000.0000 [IU] | Freq: Three times a day (TID) | INTRAMUSCULAR | Status: DC

## 2015-02-21 MED ORDER — EPINEPHRINE HCL 0.1 MG/ML IJ SOSY
PREFILLED_SYRINGE | INTRAMUSCULAR | Status: AC
  Filled 2015-02-21: qty 10

## 2015-02-21 MED ORDER — INSULIN ASPART 100 UNIT/ML IV SOLN
10.0000 [IU] | Freq: Once | INTRAVENOUS | Status: AC
  Administered 2015-02-21: 10 [IU] via INTRAVENOUS

## 2015-02-21 MED ORDER — SODIUM BICARBONATE 8.4 % IV SOLN
INTRAVENOUS | Status: AC
  Filled 2015-02-21: qty 50

## 2015-02-22 LAB — BASIC METABOLIC PANEL
ANION GAP: 14 (ref 5–15)
BUN: 204 mg/dL — ABNORMAL HIGH (ref 6–20)
CALCIUM: 7.4 mg/dL — AB (ref 8.9–10.3)
CHLORIDE: 109 mmol/L (ref 101–111)
CO2: 19 mmol/L — AB (ref 22–32)
Creatinine, Ser: 4 mg/dL — ABNORMAL HIGH (ref 0.61–1.24)
GFR calc non Af Amer: 13 mL/min — ABNORMAL LOW (ref >60)
GFR, EST AFRICAN AMERICAN: 15 mL/min — AB (ref >60)
Glucose, Bld: 191 mg/dL — ABNORMAL HIGH (ref 65–99)
Potassium: 6.6 mmol/L (ref 3.5–5.1)
SODIUM: 142 mmol/L (ref 135–145)

## 2015-02-25 ENCOUNTER — Telehealth: Payer: Self-pay

## 2015-02-25 LAB — POCT I-STAT 3, ART BLOOD GAS (G3+)
Bicarbonate: 65.8 mEq/L — ABNORMAL HIGH (ref 20.0–24.0)
O2 SAT: 96 %
pCO2 arterial: 84.9 mmHg (ref 35.0–45.0)
pH, Arterial: 7.497 — ABNORMAL HIGH (ref 7.350–7.450)
pO2, Arterial: 80 mmHg (ref 80.0–100.0)

## 2015-02-25 NOTE — Telephone Encounter (Signed)
On 02/25/2015 I received a death certificate from [facility]. The death certificate is for burial. The patient is a patient of Doctor Tyson Alias. The death certificate will be taken to [facility] (2100) for signature. On 03-26-2015 I received the death certificate back from Doctor Tyson Alias. I got the death certificate ready and called the funeral home to let them know the death certificate was being mailed to the [facility] per their request.

## 2015-02-27 MED FILL — Medication: Qty: 1 | Status: AC

## 2015-03-02 LAB — BLOOD GAS, ARTERIAL
ACID-BASE DEFICIT: 4.5 mmol/L — AB (ref 0.0–2.0)
Bicarbonate: 21.6 mEq/L (ref 20.0–24.0)
Drawn by: 311011
FIO2: 0.4
LHR: 14 {breaths}/min
O2 SAT: 97.2 %
PEEP/CPAP: 5 cmH2O
PH ART: 7.247 — AB (ref 7.350–7.450)
Patient temperature: 99
TCO2: 23.2 mmol/L (ref 0–100)
VT: 530 mL
pCO2 arterial: 51.6 mmHg — ABNORMAL HIGH (ref 35.0–45.0)
pO2, Arterial: 99.6 mmHg (ref 80.0–100.0)

## 2015-03-11 NOTE — Progress Notes (Signed)
ANTICOAGULATION CONSULT NOTE - Follow Up Consult  Pharmacy Consult for heparin Indication: pulmonary embolus  Labs:  Recent Labs  02/18/15 0250 02/18/15 0939 02/18/15 1506 02/18/15 2150 02/19/15 0355  02/20/15 0350 02/20/15 0400 02/20/15 1343 02/20/15 2336  HGB 12.7*  --   --   --  11.2*  --  10.8*  --   --   --   HCT 38.3*  --   --   --  33.8*  --  34.4*  --   --   --   PLT 101*  --   --   --  88*  --  72*  --   --   --   HEPARINUNFRC  --   --   --   --   --   < >  --  0.96* 0.80* 0.29*  CREATININE 3.27*  --  3.38*  --  3.23*  --  2.88*  --   --   --   TROPONINI  --  <0.03 0.03 0.05*  --   --   --   --   --   --   < > = values in this interval not displayed.   Assessment: 82yo male now slightly subtherapeutic on heparin after rate change.  Goal of Therapy:  Heparin level 0.3-0.7 units/ml   Plan:  Will increase heparin gtt very slightly to 800 units/hr and check level in 8hr.  Vernard Gambles, PharmD, BCPS  03/17/2015,12:09 AM

## 2015-03-11 NOTE — Discharge Summary (Signed)
Joe Gonzalez, Joe Gonzalez NO.:  [id number]  MEDICAL RECORD NO.:  [id number]  LOCATION:  2M06C                        FACILITY:  [facility]  PHYSICIAN:  [name], MD DATE OF BIRTH:  01-Jun-1933  DATE OF ADMISSION:  2015/03/08 DATE OF DISCHARGE:  02/15/2015                              DISCHARGE SUMMARY   DEATH SUMMARY:  This is a 82 year old male, with a past medical history significant for severe COPD, who has had multiple recurrent admissions since October, 2016, who here at the [facility] on 03-08-15 complaining of shortness of breath, required intubation emergently that evening, had the failed noninvasive mask ventilation. He had done horribly poor weaning for over a week.  He became worsened with tachycardia the day or 2 prior to me evaluating this patient over the weekend.  He had developed acute renal failure.  Had a right lower lobe opacification noted with concerns of a mass.  There was concerns of healthcare associated pneumonia.  There was indeterminate V/Q scan with a low clinical suspicion.  Negative Dopplers and echo showed RV function within normal limits, making this a low suspicion heparin was discontinued.  The patient was being treated with steroids.  He worsened on the morning that I evaluated the patient with apparently worsening renal failure, hyperkalemia, which was treated aggressively medically. A central line was placed, uneventfully to the normal position.  No complication and then shortly after the patient suffered a cardiac arrest, attempts at treating presumed hyperkalemia as well as ACLS were unfortunately not successful.  He had developed bright red blood coming from his NG tube right prior to this occurrence as well as abdominal distention, concerning for an abdominal catastrophic event.  The family was at the bedside and I notified them of the death, which was very difficult.  FINAL DIAGNOSIS:  Upon  death; 1. Likely abdominal catastrophic events leading to cardiac arrest. 2. Gastrointestinal (GI) bleed, likely. 3. Severe chronic obstructive pulmonary disease (COPD) with vent     dependence. 4. Likely treated pneumonia. 5. Acute renal failure. 6. Hypovolemia.     [name], MD     DJF/MEDQ  D:  03/09/2015  T:  03/09/2015  Job:  [id number]

## 2015-03-11 NOTE — Progress Notes (Signed)
eLink Physician-Brief Progress Note Patient Name: Joe Gonzalez DOB: December 26, 1933 MRN: [id number]   Date of Service  03-10-2015  HPI/Events of Note  Bradycardia - Atropine ordered. Patient does not appear to be perfusing with this rhythm. Code called. Asked nursing to start CPR/Epi. Dr. Tyson Alias at bedside.  eICU Interventions  Will defer further management to Dr. Tyson Alias.     Intervention Category Major Interventions: Arrhythmia - evaluation and management  Clydine Parkison Dennard Nip March 10, 2015, 5:41 PM

## 2015-03-11 NOTE — Procedures (Signed)
Responded to code blue  pcxr post line wnl, cvp 10 at that time, no ptx tolerated well  Pea, possible V t x 2, treated acls,.  Treated K He had similar k earlier without arrest Stomach sig distended, perf, bleed? Lungs reduced but equal unlikely PTX spont   Despite prolonged attempts did not survive acls followed i updated family at bedside A line was also placed then  Mcarthur Rossetti. Tyson Alias, MD, FACP Pgr: [phone] [place] [facility]

## 2015-03-11 NOTE — Progress Notes (Signed)
PULMONARY / CRITICAL CARE MEDICINE   Name: Joe Gonzalez MRN: [id number] DOB: 1933-04-21    ADMISSION DATE:  03/01/2015 CONSULTATION DATE:  02/16/2015  REFERRING MD:  Dr. Arbutus Leas with [facility] hospitalist service  CHIEF COMPLAINT:  Shortness of breath  HISTORY OF PRESENT ILLNESS:   This is a 82 year old male with a past medical history significant for severe COPD who has had multiple recurrent admissions since October 2016 who presented to the [facility] emergency department on 03-01-15 complaining of shortness of breath. Vent, poor weaning  SUBJECTIVE:  Tachy overnight  VITAL SIGNS: BP 103/59 mmHg  Pulse 125  Temp(Src) 98 F (36.7 C) (Oral)  Resp 20  Ht  (1.702 m)  Wt 78.7 kg (173 lb 8 oz)  BMI 27.17 kg/m2  SpO2 98%  VENTILATOR SETTINGS: Vent Mode:  [-] PSV;CPAP FiO2 (%):  [40 %] 40 % Set Rate:  [14 bmp] 14 bmp Vt Set:  [530 mL] 530 mL PEEP:  [5 cmH20] 5 cmH20 Pressure Support:  [8 cmH20] 8 cmH20 Plateau Pressure:  [14 cmH20-21 cmH20] 14 cmH20  INTAKE / OUTPUT: I/O last 3 completed shifts: In: 5668.6 [I.V.:4208.6; NG/GT:1410; IV Piggyback:50] Out: 1194 [Urine:1194]  PHYSICAL EXAMINATION: General:  Elderly male,intubated Neuro:  Sedated rass -4, no fc HEENT:  Normocephalic atraumatic Cardiovascular:  s1 s2  distant heart  tachy Lungs: distant coarse Abdomen:  Bowel sounds are positive, nontender nondistended Musculoskeletal:  Normal bulk and tone Skin:  Thin skin bilaterally with some bruising, no cyanosis  LABS:  BMET  Recent Labs Lab 02/19/15 0355 02/20/15 0350 03/05/2015 0809  NA 137 143 142  K 5.4* 5.8* 6.6*  CL 105 107 109  CO2 20* 23 19*  BUN 134* 155* 204*  CREATININE 3.23* 2.88* 4.00*  GLUCOSE 159* 136* 191*    Electrolytes  Recent Labs Lab 02/15/15 0544  02/18/15 0939  02/19/15 0355 02/20/15 0350 02/27/2015 0809  CALCIUM 8.5*  < >  --   < > 7.2* 7.4* 7.4*  MG 2.8*  --  2.7*  --   --   --   --   PHOS 4.5  --  9.0*  --   --  9.2*  --    < > = values in this interval not displayed.  CBC  Recent Labs Lab 02/19/15 0355 02/20/15 0350 02/20/2015 0228  WBC 9.0 9.5 13.1*  HGB 11.2* 10.8* 9.3*  HCT 33.8* 34.4* 28.8*  PLT 88* 72* 115*    Coag's No results for input(s): APTT, INR in the last 168 hours.  Sepsis Markers  Recent Labs Lab 02/17/15 0230 02/18/15 0250 02/19/15 0355  PROCALCITON 0.27 0.28 0.18    ABG  Recent Labs Lab 02/18/15 1311 02/19/15 1146 02/20/15 0242  PHART 7.234* 7.241* 7.247*  PCO2ART 51.2* 53.4* 51.6*  PO2ART 65.0* 123.0* 99.6    Liver Enzymes  Recent Labs Lab 02/15/15 0544  AST 21  ALT 23  ALKPHOS 49  BILITOT 0.5  ALBUMIN 2.9*    Cardiac Enzymes  Recent Labs Lab 02/18/15 0939 02/18/15 1506 02/18/15 2150  TROPONINI <0.03 0.03 0.05*    Glucose  Recent Labs Lab 02/20/15 1157 02/20/15 1527 02/20/15 1916 02/20/15 2338 03/10/2015 0333 03/04/2015 0746  GLUCAP 125* 135* 135* 155* 139* 174*    Imaging 02/16/2015 chest x-ray images personally reviewed showing a questionable right lower lobe infiltrate which is new compared to the images from 02/01/2015 there is also significant hyperinflation consistent with emphysema  02/20/15 CXR: RLL opacity likely a mass. Aeration  improving. Bibasilar infiltrates.   STUDIES:  02/16/2015 echocardiogram preserved ejection fraction, pulmonary hypertension.  02/16/2015 lower extremities Doppler negative.  02/18/2015 CXR no significant change.  02/19/2015 VQ scan perfusion defect.   CULTURES: None  ANTIBIOTICS: 02/16/2015 cefepime 02/16/2015 azithromycin > 02/19/2015  SIGNIFICANT EVENTS: Minerva Areola fourth 2017 admission 02/16/2015 moved to ICU for BiPAP 02/17/15 > Intubated 02/18/2015 > failed extubation.  02/19/2015 > perfusion defect on VQ scan. Started empirically on heparin.   LINES/TUBES: February 16 2015 Intubated    ASSESSMENT / PLAN:  PULMONARY A: Acute respiratory failure with hypoxemia Acute exacerbation of  COPD Active tobacco use Questionable healthcare associated pneumonia - NOT impressed Intolerance of nebulized med? (unclear, became more dyspneic and tachycardic after duoneb and pulmicort) indeterminate PE P:   Reduce steroids abg now May need CT chest Doppler leg neg, VQ indeterminate, low suspicion PE - consider dc heparin (echo RV fxn wnl) Wean cpap 5 ps 5, goal 2 hr Limit autopeep  CARDIOVASCULAR A:  Sinus tachycardia Elevated JVD, questionable cor pulmonale Leg swelling, edema P:  Tele cvp assessment See pulm , dc hep drip tsh ensure done  RENAL A:   Worsening renal function, unclear cause Urinary Retention Hyperkalemia  P:   Kay stat bmet q6h Bicarb drip for K  Calcium x 1 Insulin, d50 Abg, ensure ph corrected  GASTROINTESTINAL A:   No acute issues P:   Monitor BM Protonix ppx.   HEMATOLOGIC A:   No acute issues Leukocytosis Resolved Thrombocytopenia - may be dilutional.  P:  hep drip to sub qh  INFECTIOUS A:   Questionable healthcare associated pneumonia with worsening leukocytosis (the rising white count could be due to steroids) P:   Cefepime 2/78>>>end 2/12 Follow fever curve  ENDOCRINE A:   No acute issues   P:   Monitor glucose with lab work  NEUROLOGIC A:   No acute issues P:   Sedated on vent Rass goal -2 Watch closely.    FAMILY  - Updates: Wife updated at bedside. 2/11 by me  - Inter-disciplinary family meet or Palliative Care meeting due by:  Day 7   Ccm time 30  Mcarthur Rossetti. Tyson Alias, MD, FACP Pgr: [phone] [place] [facility]

## 2015-03-11 NOTE — Progress Notes (Signed)
eLink Physician-Brief Progress Note Patient Name: Joe Gonzalez DOB: Aug 22, 1933 MRN: [id number]   Date of Service  14-Mar-2015  HPI/Events of Note  Hypotension - BP = 71/39. Currently on Phenylephrine IV infusion at 100 mcg/min.  eICU Interventions  Will order: 1. Bolus 0.9 NaCl 1 liter IV over 1 hour now. 2. Monitor CVP. 3. Increase ceiling on Phenylephrine IV infusion to 400 mcg/min.     Intervention Category Major Interventions: Hypotension - evaluation and management  Lenell Antu 2015/03/14, 4:38 PM

## 2015-03-11 NOTE — Progress Notes (Signed)
ANTICOAGULATION CONSULT NOTE  Pharmacy Consult for heparin Indication: pulmonary embolus  No Known Allergies  Patient Measurements: Height:  (170.2 cm) Weight: 173 lb 8 oz (78.7 kg) IBW/kg (Calculated) : 66.1   Vital Signs: Temp: 98 F (36.7 C) (02/11 0749) Temp Source: Oral (02/11 0749) BP: 103/59 mmHg (02/11 0900) Pulse Rate: 125 (02/11 0900)  Labs:  Recent Labs  02/18/15 1506 02/18/15 2150  02/19/15 0355  02/20/15 0350  02/20/15 1343 02/20/15 2336 03/04/2015 0228 02/12/2015 0809  HGB  --   --   < > 11.2*  --  10.8*  --   --   --  9.3*  --   HCT  --   --   --  33.8*  --  34.4*  --   --   --  28.8*  --   PLT  --   --   --  88*  --  72*  --   --   --  115*  --   HEPARINUNFRC  --   --   --   --   < >  --   < > 0.80* 0.29*  --  0.43  CREATININE 3.38*  --   --  3.23*  --  2.88*  --   --   --   --  4.00*  TROPONINI 0.03 0.05*  --   --   --   --   --   --   --   --   --   < > = values in this interval not displayed.  Estimated Creatinine Clearance: 14 mL/min (by C-G formula based on Cr of 4).  Assessment: 82 yo m on heparin for r/o PE. VQ scan shows perfusion abnormalities, could not exclude PE. Heparin level within goal at 800 units/hr. Platelets rising to 115  Some slight oozing around site where blood has been drawn, but not of concern for RN  Goal of Therapy:   Heparin level 0.3-0.7 units/ml Monitor platelets by anticoagulation protocol: Yes   Plan:  Continue 800 units/hr Daily CBC, HL Monitor for s/sx of bleeding F/u plans for oral [facility] after ICU  Isaac Bliss, PharmD, BCPS, [facility] Clinical Pharmacist Pager [phone] 02/15/2015 10:06 AM

## 2015-03-11 NOTE — Procedures (Signed)
Central Venous Catheter Insertion Procedure Note Joe Gonzalez [id number] Mar 10, 1933  Procedure: Insertion of Central Venous Catheter Indications: Assessment of intravascular volume, Drug and/or fluid administration and Frequent blood sampling  Procedure Details Consent: Risks of procedure as well as the alternatives and risks of each were explained to the (patient/caregiver).  Consent for procedure obtained. Time Out: Verified patient identification, verified procedure, site/side was marked, verified correct patient position, special equipment/implants available, medications/allergies/relevent history reviewed, required imaging and test results available.  Performed  Maximum sterile technique was used including antiseptics, cap, gloves, gown, hand hygiene, mask and sheet. Skin prep: Chlorhexidine; local anesthetic administered A antimicrobial bonded/coated triple lumen catheter was placed in the right internal jugular vein using the Seldinger technique. Ultrasound guidance used.Yes.   Catheter placed to 16 cm. Blood aspirated via all 3 ports and then flushed x 3. Line sutured x 2 and dressing applied.  Evaluation Blood flow good Complications: No apparent complications Patient did tolerate procedure well. Chest X-ray ordered to verify placement.  CXR: pending.  Brett Canales Minor ACNP Adolph Pollack PCCM Pager [phone] till 3 pm If no answer page [phone] February 28, 2015, 3:31 PM  [place] Consented superviced  Mcarthur Rossetti. Tyson Alias, MD, FACP Pgr: [phone] [place] [facility]

## 2015-03-11 NOTE — Procedures (Signed)
Arterial Catheter Insertion Procedure Note Joe Gonzalez [id number] April 20, 1933  Procedure: Insertion of Arterial Catheter  Indications: Blood pressure monitoring, Frequent blood sampling and code  Procedure Details Consent: emergent Time Out: Verified patient identification, verified procedure, site/side was marked, verified correct patient position, special equipment/implants available, medications/allergies/relevent history reviewed, required imaging and test results available.  Performed  Maximum sterile technique was used including antiseptics, cap, gloves, gown and sheet. Skin prep: Chlorhexidine; local anesthetic administered 20 gauge catheter was inserted into right femoral artery using the Seldinger technique.  Evaluation Blood flow good; BP tracing good. Complications: No apparent complications.   Joe Gonzalez 03/05/2015   Joe  Mcarthur Gonzalez. Tyson Alias, MD, FACP Pgr: [phone] [place] [facility]

## 2015-03-11 NOTE — Progress Notes (Signed)
   03/07/2015 1800  Clinical Encounter Type  Visited With Family;Patient not available  Visit Type Death;Code  Spiritual Encounters  Spiritual Needs Grief support;Emotional;Prayer  CH called for Code Blue; CH support family as family notified of pt death; CH offered emotional, grief and spiritual support; Funeral home is [employer] in [place] [phone] [place]) [phone]. 6:32 PM Erline Levine

## 2015-03-11 DEATH — deceased

## 2015-04-06 ENCOUNTER — Ambulatory Visit: Payer: Medicare Other | Admitting: Internal Medicine

## 2017-07-12 IMAGING — DX DG CHEST 1V PORT
1 series · 1 of 1 positions shown · non-contrast
Comparison: 10/24/2014

CLINICAL DATA: Worsening shortness of breath. Sudden onset 5 hours
ago. Chest tightness. Bilateral lower extremity swelling. Wheezing.

EXAM:
PORTABLE CHEST 1 VIEW

[chest ap]
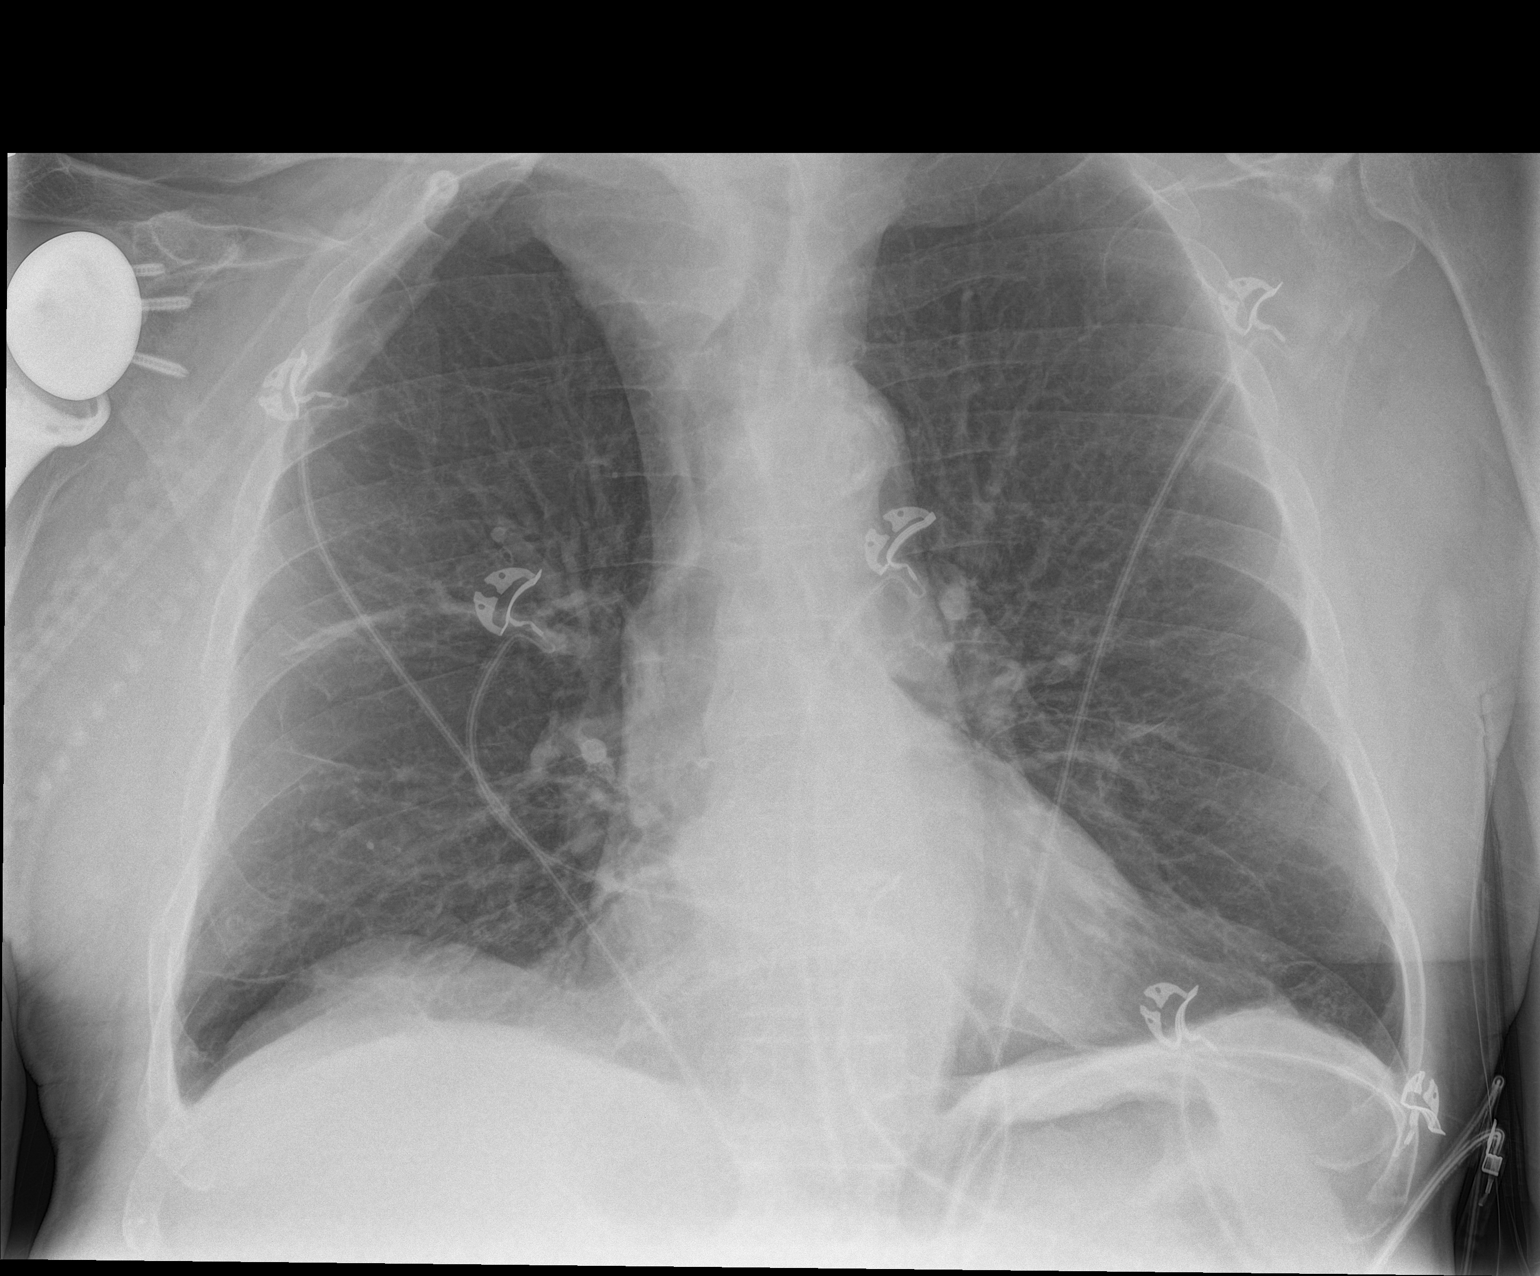

[1 of 1 positions shown; findings below may reference images not displayed]

FINDINGS: Normal heart size and pulmonary vascularity. Emphysematous changes
in the lungs. Linear fibrosis or atelectasis in the mid lungs and
lung bases. No focal airspace disease or consolidation. No blunting
of costophrenic angles. No pneumothorax. Calcified and tortuous
aorta. Postoperative changes in both shoulders.
IMPRESSION: Emphysematous changes and scattered fibrosis in the lungs. No
evidence of active pulmonary disease.
